# Patient Record
Sex: Male | Born: 1975 | Race: Black or African American | Hispanic: No | Smoking: Current some day smoker
Health system: Southern US, Community
[De-identification: ages and names within clinical notes are randomized; demographics above are authoritative.]

## PROBLEM LIST (undated history)

## (undated) DIAGNOSIS — J189 Pneumonia, unspecified organism: Secondary | ICD-10-CM

## (undated) DIAGNOSIS — R634 Abnormal weight loss: Secondary | ICD-10-CM

## (undated) DIAGNOSIS — D860 Sarcoidosis of lung: Secondary | ICD-10-CM

## (undated) DIAGNOSIS — I10 Essential (primary) hypertension: Secondary | ICD-10-CM

## (undated) HISTORY — DX: Sarcoidosis of lung: D86.0

## (undated) HISTORY — DX: Pneumonia, unspecified organism: J18.9

## (undated) HISTORY — DX: Essential (primary) hypertension: I10

## (undated) HISTORY — DX: Abnormal weight loss: R63.4

---

## 2005-07-24 ENCOUNTER — Emergency Department (HOSPITAL_COMMUNITY): Admission: EM | Admit: 2005-07-24 | Discharge: 2005-07-24 | Payer: Self-pay | Admitting: Emergency Medicine

## 2005-07-30 ENCOUNTER — Emergency Department (HOSPITAL_COMMUNITY): Admission: EM | Admit: 2005-07-30 | Discharge: 2005-07-30 | Payer: Self-pay | Admitting: Emergency Medicine

## 2005-08-08 ENCOUNTER — Ambulatory Visit: Payer: Self-pay | Admitting: Internal Medicine

## 2005-12-03 DIAGNOSIS — D860 Sarcoidosis of lung: Secondary | ICD-10-CM

## 2005-12-03 HISTORY — DX: Sarcoidosis of lung: D86.0

## 2006-01-26 ENCOUNTER — Emergency Department (HOSPITAL_COMMUNITY): Admission: EM | Admit: 2006-01-26 | Discharge: 2006-01-26 | Payer: Self-pay | Admitting: Emergency Medicine

## 2006-01-28 ENCOUNTER — Emergency Department (HOSPITAL_COMMUNITY): Admission: EM | Admit: 2006-01-28 | Discharge: 2006-01-28 | Payer: Self-pay | Admitting: Emergency Medicine

## 2006-02-01 ENCOUNTER — Ambulatory Visit: Payer: Self-pay | Admitting: Internal Medicine

## 2006-02-07 ENCOUNTER — Ambulatory Visit: Payer: Self-pay | Admitting: Internal Medicine

## 2006-02-07 ENCOUNTER — Ambulatory Visit: Admission: RE | Admit: 2006-02-07 | Discharge: 2006-02-07 | Payer: Self-pay | Admitting: Internal Medicine

## 2006-02-07 ENCOUNTER — Encounter (INDEPENDENT_AMBULATORY_CARE_PROVIDER_SITE_OTHER): Payer: Self-pay | Admitting: *Deleted

## 2006-02-07 HISTORY — PX: OTHER SURGICAL HISTORY: SHX169

## 2006-02-13 ENCOUNTER — Ambulatory Visit: Payer: Self-pay | Admitting: Internal Medicine

## 2006-03-28 ENCOUNTER — Ambulatory Visit: Payer: Self-pay | Admitting: Internal Medicine

## 2007-03-24 ENCOUNTER — Emergency Department (HOSPITAL_COMMUNITY): Admission: EM | Admit: 2007-03-24 | Discharge: 2007-03-24 | Payer: Self-pay | Admitting: Diagnostic Radiology

## 2007-08-12 ENCOUNTER — Emergency Department (HOSPITAL_COMMUNITY): Admission: EM | Admit: 2007-08-12 | Discharge: 2007-08-12 | Payer: Self-pay | Admitting: Emergency Medicine

## 2008-09-20 ENCOUNTER — Emergency Department (HOSPITAL_COMMUNITY): Admission: EM | Admit: 2008-09-20 | Discharge: 2008-09-20 | Payer: Self-pay | Admitting: Emergency Medicine

## 2009-02-27 ENCOUNTER — Emergency Department (HOSPITAL_COMMUNITY): Admission: EM | Admit: 2009-02-27 | Discharge: 2009-02-27 | Payer: Self-pay | Admitting: Emergency Medicine

## 2009-04-18 ENCOUNTER — Emergency Department (HOSPITAL_COMMUNITY): Admission: EM | Admit: 2009-04-18 | Discharge: 2009-04-18 | Payer: Self-pay | Admitting: Emergency Medicine

## 2009-06-07 DIAGNOSIS — D869 Sarcoidosis, unspecified: Secondary | ICD-10-CM | POA: Insufficient documentation

## 2009-06-07 DIAGNOSIS — R634 Abnormal weight loss: Secondary | ICD-10-CM | POA: Insufficient documentation

## 2009-06-07 DIAGNOSIS — I10 Essential (primary) hypertension: Secondary | ICD-10-CM | POA: Insufficient documentation

## 2009-06-07 DIAGNOSIS — R0602 Shortness of breath: Secondary | ICD-10-CM | POA: Insufficient documentation

## 2009-06-07 DIAGNOSIS — J189 Pneumonia, unspecified organism: Secondary | ICD-10-CM | POA: Insufficient documentation

## 2009-06-08 ENCOUNTER — Ambulatory Visit: Payer: Self-pay | Admitting: Internal Medicine

## 2009-06-18 ENCOUNTER — Emergency Department (HOSPITAL_COMMUNITY): Admission: EM | Admit: 2009-06-18 | Discharge: 2009-06-18 | Payer: Self-pay | Admitting: Emergency Medicine

## 2009-07-21 ENCOUNTER — Ambulatory Visit: Payer: Self-pay | Admitting: Internal Medicine

## 2009-09-01 ENCOUNTER — Ambulatory Visit: Payer: Self-pay | Admitting: Internal Medicine

## 2009-10-05 ENCOUNTER — Telehealth (INDEPENDENT_AMBULATORY_CARE_PROVIDER_SITE_OTHER): Payer: Self-pay | Admitting: *Deleted

## 2009-10-18 ENCOUNTER — Ambulatory Visit: Payer: Self-pay | Admitting: Internal Medicine

## 2010-05-24 ENCOUNTER — Ambulatory Visit: Payer: Self-pay | Admitting: Internal Medicine

## 2010-05-24 DIAGNOSIS — R079 Chest pain, unspecified: Secondary | ICD-10-CM | POA: Insufficient documentation

## 2010-10-03 ENCOUNTER — Telehealth: Payer: Self-pay | Admitting: Internal Medicine

## 2010-11-23 ENCOUNTER — Ambulatory Visit: Payer: Self-pay | Admitting: Internal Medicine

## 2010-12-25 ENCOUNTER — Telehealth: Payer: Self-pay | Admitting: Internal Medicine

## 2011-01-02 NOTE — Assessment & Plan Note (Signed)
Summary: Pulmonary/ f/u sarcoidosis   Primary Provider/Referring Provider:  none  CC:  Followup sarcoid.  Pt last seen in Nov 2010.  C/o chest tightness x 1 wk.  He has occ dry cough and has cp when coughs.  He has also noticed slight increased SOB with exertion since schest tightnesss started.  Marland Kitchen  History of Present Illness: 62  yobm quit smoking smoking 03/2009 dx with sarcoid 2006 cough wt loss sob, rx prednisone and eventually tapered it off in 2007  June 08, 2009 after hosp in May 2010 at [facility] cough with deep breath and sob with activity, feels just like his sarcoid.   rec prednisone 10 2  each am until better then taper to one half  July 21, 2009 ov 100% except doe x exertion so decreased  to 10 mg one half daily.   October 18, 2009 cc  his breathing has overall been okay.  Pt taking 5 mg of prednisone qd.  No complaints of aches/ pains/ rash/sob /coough/ocular co's. rec try pred 5 mg every other day then return for f/u, did not return as rec.  May 24, 2010 ov Followup sarcoid.  finished prednisone rx around 01/2010 breathing did fine off it completely.   Now C/o chest tightness x 1 wk.  He has occ dry cough and has cp when coughs.  He has also noticed slight increased cough > SOB with exertion since chest tightnesss started.  Had been lifting heavy wts at onset of pain localized to right anterior chest wall without radiation. Pt denies any significant sore throat, dysphagia, itching, sneezing,  nasal congestion or excess secretions,  fever, chills, sweats, unintended wt loss, classic lateralizing  pleuritic or exertional cp, hempoptysis, change in activity tolerance  orthopnea pnd or leg swelling, ocular c/o, rash, arthritis.  Current Medications (verified): 1)  Prednisone 10 Mg  Tabs (Prednisone) .Marland Kitchen.. 1 Once Daily As Directed 2)  Maxzide 75-50 Mg Tabs (Triamterene-Hctz) .... One Daily  Allergies (verified): No Known Drug Allergies  Past History:  Past Medical History: Hx of  PNEUMONIA (ICD-486) PULMONARY SARCOIDOSIS (ICD-135)....................................Marland KitchenWert    - Dx with TBBX here 02/07/06, ace 79    - Prednisone restarted 06/08/09 > stopped 01/2010 with recurrent cough 05/2010 Hx of WEIGHT LOSS (ICD-783.21) Hypertension after use of prednisone  Vital Signs:  Patient profile:   35 year old male Weight:      212 pounds BMI:     28.86 O2 Sat:      94 % on Room air Temp:     97.1 degrees F oral Pulse rate:   92 / minute BP sitting:   120 / 90  (right arm)  Vitals Entered By: Vernie Murders (May 24, 2010 2:20 PM)  O2 Flow:  Room air  Physical Exam  Additional Exam:  wt 223  June 09 2009 > 222 July 21, 2009 > 224 October 18, 2009 > 212 May 24, 2010  HEENT: nl dentition, turbinates, and orophanx. Nl external ear canals without cough reflex NECK :  without JVD/Nodes/TM/ nl carotid upstrokes bilaterally LUNGS: no acc muscle use, clear to A and P bilaterally without cough on insp or exp maneuvers CV:  RRR  no s3 or murmur or increase in P2, no edema  ABD:  soft and nontender with nl excursion in the supine position. No bruits or organomegaly, bowel sounds nl MS:  warm without deformities, calf tenderness, cyanosis or clubbing      CXR  Procedure date:  05/24/2010  Findings:      Comparison: 04/18/2009   Findings: Heart and mediastinal contours normal.  No evidence for an abnormal density in the right upper lobe on today's exam.  This density appears to have resolved.   No pleural fluid or osseous lesions.   IMPRESSION: Currently no active disease - right upper lobe density noted previously appears to have resolved.  Impression & Recommendations:  Problem # 1:  PULMONARY SARCOIDOSIS (ICD-135) The goal with a chronic steroid dependent illness is always arriving at the lowest effective dose that controls the disease/symptoms and not accepting a set "formula" which is based on statistics that don't take into accound individual variability or  the natural hx of the dz in every individual patient, which may well vary over time.  For now ceiling is 20 mg and floor will be 5 mg every other day, not 0, at least for now  Problem # 2:  CHEST PAIN (ICD-786.50)  c/w mscp  try rx with nsaids  Orders: Est. Patient Level III ([address])  Medications Added to Medication List This Visit: 1)  Prednisone 10 Mg Tabs (Prednisone) .Marland Kitchen.. 1 once daily as directed  Other Orders: T-2 View CXR (71020TC)  Patient Instructions: 1)  Prednisone ceiling is 20 mg per day so take 10 mg take 2 each am until back to normal then taper a floor of 10 mg one half even days 2)  Return to office in 3 months, sooner if needed  3)  Advil or aleve with meals will probably help with chest pain

## 2011-01-02 NOTE — Progress Notes (Signed)
Summary: refill on prednisone  Phone Note Call from Patient Call back at Home Phone [phone]   Caller: Patient Call For: wert Reason for Call: Refill Medication Summary of Call: Need refill on prednisone 10mg .//[facility] [address] Initial call taken by: Darletta Moll,  October 03, 2010 4:15 PM  Follow-up for Phone Call        pt states he is currenlty takin g1/2 10mg  tablet every other day. refill sent.Carron Curie CMA  October 03, 2010 4:58 PM     Prescriptions: PREDNISONE 10 MG  TABS (PREDNISONE) 1 once daily as directed  #30 x 3   Entered by:   Carron Curie CMA   Authorized by:   Nyoka Cowden MD   Signed by:   Carron Curie CMA on 10/03/2010   Method used:   Electronically to        [facility] [address] (retail)       [phone]       [place], [place]  [address]       Ph: [phone]       Fax: [phone]   RxID:   [id number]

## 2011-01-04 NOTE — Progress Notes (Signed)
Summary: nos appt  Phone Note Call from Patient   Caller: [email or web address]  Call For: Jacier Gladu Summary of Call: Rsc nos from 1/20 to 2/14. Initial call taken by: Darletta Moll,  December 25, 2010 10:14 AM

## 2011-01-16 ENCOUNTER — Ambulatory Visit: Payer: Self-pay | Admitting: Internal Medicine

## 2011-01-31 ENCOUNTER — Ambulatory Visit: Payer: Self-pay | Admitting: Internal Medicine

## 2011-03-20 ENCOUNTER — Telehealth: Payer: Self-pay | Admitting: Internal Medicine

## 2011-03-20 NOTE — Telephone Encounter (Signed)
Last chance 1) schedule ov 2) give just enough for to last until ov 3) no more without keeping this appt

## 2011-03-20 NOTE — Telephone Encounter (Signed)
Pt hasn't seen MW since June 2011!!  And at that time, was instructed to f/u in 3 months which pt never did.  Pt keeps either cancelling or no showing for his appts.  MW please advise if you want to refill his Prednisone or not.  Thanks.

## 2011-03-21 ENCOUNTER — Telehealth: Payer: Self-pay | Admitting: Internal Medicine

## 2011-03-21 MED ORDER — PREDNISONE 10 MG PO TABS: 10.0000 mg | ORAL_TABLET | Freq: Every day | ORAL | Status: AC

## 2011-03-21 NOTE — Telephone Encounter (Signed)
Pt wanting refill on Prednisone. Previous phone note was closed from 03/20/11.  Per MW Last chance : 1) schedule ov  2) give just enough for to last until ov  3) no more without keeping this appt  LMOMTCB X1 to schedule OV so we can send pred enough to last to OV

## 2011-03-21 NOTE — Telephone Encounter (Signed)
Pt scheduled OV with MW on Thurs., 04/05/2011 @ 9:45 am and is aware he will need to keep that appt for any further refills. RX for Prednisone sent to [facility] on [address].

## 2011-04-03 ENCOUNTER — Encounter: Payer: Self-pay | Admitting: Internal Medicine

## 2011-04-05 ENCOUNTER — Telehealth: Payer: Self-pay | Admitting: Internal Medicine

## 2011-04-05 ENCOUNTER — Ambulatory Visit: Payer: Self-pay | Admitting: Internal Medicine

## 2011-04-05 MED ORDER — PREDNISONE 10 MG PO TABS: 10.0000 mg | ORAL_TABLET | Freq: Every day | ORAL | Status: AC

## 2011-04-05 NOTE — Telephone Encounter (Signed)
Pt wants refill on his prednisone 10 mg. Pt was last seen 05/24/10 and was to f/u in 3 months and has not. Pt has an apt coming up 5/23 at 10:30. Please advise Dr. Sherene Sires if okay to refill. Thanks  Carver Fila, CMA

## 2011-04-05 NOTE — Telephone Encounter (Signed)
Only give enough that will last until ov at a dose of no more than 10 mg daily = 20 pills x 10 mg each but have him continue to take them as per last set of instructions

## 2011-04-05 NOTE — Telephone Encounter (Signed)
Pt is aware and will keep ov on 5/23 with MW.

## 2011-04-17 NOTE — Consult Note (Signed)
NAMEAB, LEAMING NO.:  [id number]   MEDICAL RECORD NO.:  [id number]          PATIENT TYPE:  EMS   LOCATION:  [facility]                         FACILITY:  [facility]   PHYSICIAN:  Renee Ramus, MD       DATE OF BIRTH:  06/19/1976   DATE OF CONSULTATION:  09/20/2008  DATE OF DISCHARGE:  09/20/2008                                 CONSULTATION   HISTORY OF PRESENT ILLNESS:  The patient is a 35 year old male who is  seen for electric shocks that occur along the precordium of his chest,  increasing with inspiration and cough.  The patient says this is  consistent with previous episodes of sarcoidosis.  The patient denies  fever, chills, night sweats, nausea, vomiting, PND, or orthopnea.  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  The  patient is currently asymptomatic.  He says that his pain has resolved.  The patient says that the pain lasts only for seconds.  He is  exacerbated with deep inspiration and with cough.   PAST MEDICAL HISTORY:  Sarcoidosis.   SOCIAL HISTORY:  The patient reports smoking 3-4 cigarettes a week.  No  alcohol or drug use.  Lives at home.   FAMILY HISTORY:  Not available.   REVIEW OF SYSTEMS:  All other comprehensive review of systems are  negative.   MEDICATIONS:  None.   ALLERGIES:  NKDA.   PHYSICAL EXAMINATION:  GENERAL:  This is a well-developed, well-  nourished black male, currently no apparent distress.  VITAL SIGNS:  Temperature 97, respiratory rate 20, heart rate 82, and  blood pressure 152/102.  NECK:  No jugular venous distention or lymphadenopathy.  HEENT:  Oropharynx is clear.  Mucous membranes pink and moist.  TMs  clear bilaterally.  Pupils equal, reactive to light and accommodation.  Extraocular muscles are intact.  CARDIOVASCULAR:  He has a regular rate and  rhythm without murmurs, rubs,  or gallops.  He has physiologically split S2.  PULMONARY:  Lungs are clear to auscultation bilaterally with the  exception of some faint wheezes at the bases.  ABDOMEN:  Soft, nontender, and nondistended without hepatosplenomegaly.  Bowel sounds are present.  There is no rebound or guarding.  EXTREMITIES:  He has no clubbing, cyanosis, or edema.  He has good  peripheral pulses in dorsalis pedis and radial arteries.  He is able to  move all extremities.  NEUROLOGIC:  Cranial nerves II through XII are grossly intact.  He has  no focal neurological deficits.   LABORATORY STUDIES:  White count 6.6, H and H 17 and 50, MCV 87, and  platelets 367.  Sodium 140, potassium 3.7, chloride 99, bicarb 32, BUN  14, creatinine 1.3, and glucose 98.   EKG shows left anterior fascicular block with left axis deviation, and  although it is read as septal infarct.  He actually has Q waves, so he  has no evidence of infarct.  Troponin I is less than 0.05.  Chest x-ray  shows hyperinflated lungs with flattened diaphragms bilaterally and some  hilar adenopathy bilaterally.   ASSESSMENT AND PLAN:  1. Sarcoidosis.  The patient's atypical chest pain is most likely      resultant of his progressive sarcoidosis.  He has not been taking      any medicine for this and has not seen by outside physician for      this.  We will institute therapy with prednisone 40 mg daily and      follow up with Pulmonology.  2. Atypical chest pain.  I do not believe this represents acute      coronary syndrome.  The patient's risk factors are relatively low.      His chest pain is certainly atypical and he is at really low risk      for this.  I am going to do further workup in this area.  3. Erythrocytosis, most likely secondary to hypoxia.  Question      deteriorating lung function with advancing either obstructive or      restrictive lung disease from sarcoid.  I am concerned the patient       needs pulmonary function testing and additional monitoring as an      outpatient.  Again, he should follow up with Pulmonology.  4. Hypertension.  We will begin ACE inhibitor, lisinopril and follow      up with primary care physician.  5. Disposition.  We will discharge home with pulmonary followup.   Consult note was constructed by reviewing past medical history,  confirming with emergency medical room physician, and reviewing the  emergency medical record.   TIME SPENT:  One hour.      Renee Ramus, MD  Electronically Signed     JF/MEDQ  D:  09/20/2008  T:  09/21/2008  Job:  [id number]

## 2011-04-19 ENCOUNTER — Encounter: Payer: Self-pay | Admitting: Internal Medicine

## 2011-04-20 NOTE — Op Note (Signed)
Kenneth Delacruz, Kenneth Delacruz               ACCOUNT NO.:  [id number]   MEDICAL RECORD NO.:  [id number]          PATIENT TYPE:  AMB   LOCATION:  CARD                         FACILITY:  [facility]   PHYSICIAN:  Casimiro Needle B. Sherene Sires, M.D. Romualdo Bolk OF BIRTH:  05-28-1976   DATE OF PROCEDURE:  02/07/2006  DATE OF DISCHARGE:                                 OPERATIVE REPORT   REFERRED BY:  Manning Charity, MD.   INDICATIONS FOR PROCEDURE:  Please see attached pulmonary consultation note  done in the office within the last week. There has been no change in the  history or exam since the evaluation. The patient agreed to the procedure  after a full discussion of the risks, benefits, and alternatives.   The procedure was done in the bronchoscopy suite with continuous monitoring  by surface ECG and oximetry. The patient maintained adequate saturations  throughout this procedure in sinus rhythm on 4 liters oxygen per nasal  prongs.   Using a standard flexible fiberoptic bronchoscope, the left naris was easily  cannulated with good visualization of the entire oropharynx and  larynx and  no obvious lesion seen.   Using an additional 1% lidocaine as needed, the entire tracheobronchial tree  was explored bilaterally with the following findings.   Severe extensive diffuse cobblestoning involving all the airways but all the  airways did open widely to inspiration. No excess secretions.   DESCRIPTION OF PROCEDURE:  1.  Using a wedge positioner within the apical segment of the right upper      lobe, four adequate transbronchial biopsies were obtained with no      obvious immediate, pneumothorax or hemorrhage.  2.  The right middle lobe was lavaged for AFB and fungal stain and culture      and cytology.   The patient tolerated the procedure well with followup chest x-ray pending.   IMPRESSION:  Sarcoidosis involving the airways and lung parenchyma.   RECOMMENDATIONS:  Await confirmatory biopsies before initiating  therapy and  also rule out tuberculosis or other __________ processes.   ADDENDUM:  He was premedicated with a total of 50 mg of IV Demerol and 5 mg  of  IV Versed for adequate sedation and cough suppression. Additionally his  airways were prepared with 1% lidocaine by updraft nebulizer and the left  and right nares were prepared with 2% lidocaine jelly.           ______________________________  Charlaine Dalton Sherene Sires, M.D. [facility]     MBW/MEDQ  D:  02/07/2006  T:  02/08/2006  Job:  [id number]   cc:   Manning Charity, MD  Fax: [phone]

## 2011-04-25 ENCOUNTER — Ambulatory Visit (INDEPENDENT_AMBULATORY_CARE_PROVIDER_SITE_OTHER): Payer: Self-pay | Admitting: Internal Medicine

## 2011-04-25 ENCOUNTER — Ambulatory Visit (INDEPENDENT_AMBULATORY_CARE_PROVIDER_SITE_OTHER)
Admission: RE | Admit: 2011-04-25 | Discharge: 2011-04-25 | Disposition: A | Discharge status: Home / Self Care | Payer: Self-pay | Source: Ambulatory Visit | Attending: Internal Medicine | Admitting: Internal Medicine

## 2011-04-25 ENCOUNTER — Encounter: Payer: Self-pay | Admitting: Internal Medicine

## 2011-04-25 VITALS — BP 148/94 | HR 70 | Temp 98.5°F | Ht 71.0 in | Wt 225.6 lb

## 2011-04-25 DIAGNOSIS — D869 Sarcoidosis, unspecified: Secondary | ICD-10-CM

## 2011-04-25 DIAGNOSIS — I10 Essential (primary) hypertension: Secondary | ICD-10-CM

## 2011-04-25 NOTE — Assessment & Plan Note (Signed)
The goal with a chronic steroid dependent illness is always arriving at the lowest effective dose that controls the disease/symptoms and not accepting a set "formula" which is based on statistics or guidelines that don't always take into account patient  variability or the natural hx of the dz in every individual patient, which may well vary over time.  For now therefore I recommend the patient maintain  20 mg as ceiling and 5 mg qod as floor but only if symptoms recur off it - this many years out it may well not flare at this pont off rx.

## 2011-04-25 NOTE — Progress Notes (Signed)
Subjective:     Patient ID: Kenneth Delacruz, male   DOB: 1976/05/21, 35 y.o.   MRN: [id number]  HPI   35  yobm quit smoking smoking 03/2009 dx with sarcoid 2006 cough wt loss sob, rx prednisone and eventually tapered it off in 2007   June 08, 2009 after hosp in May 2010 at [facility] cough with deep breath and sob with activity, feels just like his sarcoid. rec prednisone 10 2 each am until better then taper to one half   July 21, 2009 ov 100% except doe x exertion so decreased to 10 mg one half daily.   October 18, 2009 cc his breathing has overall been okay. Pt taking 5 mg of prednisone qd. No complaints of aches/ pains/ rash/sob /coough/ocular co's. rec try pred 5 mg every other day then return for f/u, did not return as rec.   May 24, 2010 ov Followup sarcoid. finished prednisone rx around 01/2010 breathing did fine off it completely. > maintain on 5 mg qod   04/25/2011 ov/Shakerria Parran cc doe heavy ex only. No ocular or articular symptoms, rash or cough off prednisone x 2 weeks when ran out without flare of any of the previous symptoms.  Pt denies any significant sore throat, dysphagia, itching, sneezing,  nasal congestion or excess/ purulent secretions,  fever, chills, sweats, unintended wt loss, pleuritic or exertional cp, hempoptysis, orthopnea pnd or leg swelling.    Also denies any obvious fluctuation of symptoms with weather or environmental changes or other aggravating or alleviating factors.     :  Past Medical History:  Hx of PNEUMONIA (ICD-486)  PULMONARY SARCOIDOSIS (ICD-135)....................................Marland KitchenWert  - Dx with TBBX here 02/07/06, ace 79  - Prednisone restarted 06/08/09 > stopped 01/2010 with recurrent cough 05/2010  Hx of WEIGHT LOSS (ICD-783.21)  Hypertension after use of prednisone     Review of Systems     Objective:   Physical Exam amb bm nad   Wt 225 04/25/2011  HEENT: nl dentition, turbinates, and orophanx. Nl external ear canals without cough reflex   NECK :   without JVD/Nodes/TM/ nl carotid upstrokes bilaterally   LUNGS: no acc muscle use, clear to A and P bilaterally without cough on insp or exp maneuvers   CV:  RRR  no s3 or murmur or increase in P2, no edema   ABD:  soft and nontender with nl excursion in the supine position. No bruits or organomegaly, bowel sounds nl  MS:  warm without deformities, calf tenderness, cyanosis or clubbing  SKIN: warm and dry without lesions    NEURO:  alert, approp, no deficits     cxr 04/25/2011  Comparison: PA and lateral chest 05/24/2010 and 04/18/2009. CT  chest 01/26/2006.  Findings: Lungs are clear. Heart size is normal. No pneumothorax  or pleural effusion. No focal bony abnormality.  IMPRESSION:  No acute finding.  Assessment:         Plan:

## 2011-04-25 NOTE — Assessment & Plan Note (Signed)
May well resolve if not on steroids, advised to avoid salt and follow

## 2011-04-25 NOTE — Patient Instructions (Addendum)
The lowest dose of prednisone is always the best dose as long as it works  Try off prednisone completely and if the same problem comes back start back on prednisone 10 mg per day until back to normal, then one half x two weeks and then one half on odd days until seen every 3 months

## 2011-04-26 NOTE — Progress Notes (Signed)
Quick Note:  Spoke with pt and notified of results per Dr. Wert. Pt verbalized understanding and denied any questions.  ______ 

## 2011-07-17 ENCOUNTER — Telehealth: Payer: Self-pay | Admitting: Internal Medicine

## 2011-07-17 ENCOUNTER — Other Ambulatory Visit: Payer: Self-pay | Admitting: Internal Medicine

## 2011-07-17 MED ORDER — PREDNISONE 10 MG PO TABS: ORAL_TABLET | ORAL | Status: DC

## 2011-07-17 NOTE — Telephone Encounter (Signed)
Spoke with patient-aware that refills have been sent (spoke with CY regarding amount to send to pharmacy).

## 2011-08-07 ENCOUNTER — Ambulatory Visit: Payer: Self-pay | Admitting: Internal Medicine

## 2011-08-21 ENCOUNTER — Ambulatory Visit: Payer: Self-pay | Admitting: Internal Medicine

## 2011-09-03 LAB — CBC
HCT: 47.3
Hemoglobin: 15.7
MCHC: 33.2
Platelets: 367
RDW: 13.8
WBC: 6.6

## 2011-09-03 LAB — POCT I-STAT 3, ART BLOOD GAS (G3+)
Acid-Base Excess: 2
Bicarbonate: 27.7 — ABNORMAL HIGH
O2 Saturation: 98
Patient temperature: 98.6

## 2011-09-03 LAB — POCT CARDIAC MARKERS
CKMB, poc: 2.4
Troponin i, poc: <0.05

## 2011-09-03 LAB — DIFFERENTIAL
Basophils Absolute: 0
Eosinophils Absolute: 0.1
Lymphs Abs: 2

## 2011-09-03 LAB — D-DIMER, QUANTITATIVE: D-Dimer, Quant: <0.22

## 2011-09-03 LAB — POCT I-STAT, CHEM 8
BUN: 14
Calcium, Ion: 1.22
Creatinine, Ser: 1.3
Glucose, Bld: 98
Potassium: 3.7
TCO2: 32

## 2011-09-03 LAB — URINALYSIS, ROUTINE W REFLEX MICROSCOPIC
Nitrite: NEGATIVE
Specific Gravity, Urine: 1.016
Urobilinogen, UA: 0.2

## 2011-09-11 ENCOUNTER — Encounter: Payer: Self-pay | Admitting: Internal Medicine

## 2011-09-11 ENCOUNTER — Ambulatory Visit (INDEPENDENT_AMBULATORY_CARE_PROVIDER_SITE_OTHER): Payer: Self-pay | Admitting: Internal Medicine

## 2011-09-11 DIAGNOSIS — D869 Sarcoidosis, unspecified: Secondary | ICD-10-CM

## 2011-09-11 DIAGNOSIS — I10 Essential (primary) hypertension: Secondary | ICD-10-CM

## 2011-09-11 MED ORDER — PREDNISONE 10 MG PO TABS: ORAL_TABLET | ORAL | Status: DC

## 2011-09-11 MED ORDER — TRIAMTERENE-HCTZ 75-50 MG PO TABS: ORAL_TABLET | ORAL | Status: DC

## 2011-09-11 NOTE — Patient Instructions (Addendum)
The lowest dose of prednisone is always the best dose as long as it works - zero prednisone does not appear to be an adequate dose.  Restart  Prednisone 10 mg per day until back to normal, then one half x two weeks and then one half on odd days only  until seen every 3 months - if condition worsens go back to 10 mg per day and start.

## 2011-09-11 NOTE — Progress Notes (Signed)
Subjective:     Patient ID: Kenneth Delacruz, male   DOB: 04/06/1976, 35 y.o.   MRN: [id number]  HPI   34  yobm quit smoking smoking 03/2009 dx with sarcoid 2006 cough wt loss sob, rx prednisone and eventually tapered it off in 2007   June 08, 2009 after hosp in May 2010 at [facility] cough with deep breath and sob with activity, feels just like his sarcoid. rec prednisone 10 2 each am until better then taper to one half   July 21, 2009 ov 100% except doe x exertion so decreased to 10 mg one half daily.   October 18, 2009 cc his breathing has overall been okay. Pt taking 5 mg of prednisone qd. No complaints of aches/ pains/ rash/sob /coough/ocular co's. rec try pred 5 mg every other day then return for f/u, did not return as rec.   May 24, 2010 ov Followup sarcoid. finished prednisone rx around 01/2010 breathing did fine off it completely. > maintain on 5 mg qod   04/25/2011 ov/Kenneth Delacruz cc doe heavy ex only. No ocular or articular symptoms, rash or cough off prednisone x 2 weeks when ran out without flare of any of the previous symptoms. rec The lowest dose of prednisone is always the best dose as long as it works  Try off prednisone completely and if the same problem comes back start back on prednisone 10 mg per day until back to normal, then one half x two weeks and then one half on odd days until seen every 3 months     09/11/2011 f/u ov/Kenneth Delacruz cc increased SOB and prod cough with clear sputum for approx 10 days - this occurred when he stopped prednisone 2 weeks ago.  Pt denies any significant sore throat, dysphagia, itching, sneezing,  nasal congestion or excess/ purulent secretions,  fever, chills, sweats, unintended wt loss, pleuritic or exertional cp, hempoptysis, orthopnea pnd or leg swelling.    Also denies any obvious fluctuation of symptoms with weather or environmental changes or other aggravating or alleviating factors.     :  Past Medical History:  Hx of PNEUMONIA (ICD-486)  PULMONARY  SARCOIDOSIS (ICD-135)....................................Marland KitchenWert  - Dx with TBBX here 02/07/06, ace 79  - Prednisone restarted 06/08/09 > stopped 01/2010 with recurrent cough 05/2010  Hx of WEIGHT LOSS (ICD-783.21)  Hypertension after use of prednisone     Review of Systems     Objective:   Physical Exam  amb bm nad    Wt 225 04/25/2011  > 09/11/2011  217  HEENT: nl dentition, turbinates, and orophanx. Nl external ear canals without cough reflex   NECK :  without JVD/Nodes/TM/ nl carotid upstrokes bilaterally   LUNGS: no acc muscle use, clear to A and P bilaterally without cough on insp or exp maneuvers   CV:  RRR  no s3 or murmur or increase in P2, no edema   ABD:  soft and nontender with nl excursion in the supine position. No bruits or organomegaly, bowel sounds nl  MS:  warm without deformities, calf tenderness, cyanosis or clubbing      cxr 04/25/2011  Comparison: PA and lateral chest 05/24/2010 and 04/18/2009. CT  chest 01/26/2006.  Findings: Lungs are clear. Heart size is normal. No pneumothorax  or pleural effusion. No focal bony abnormality.  IMPRESSION:  No acute finding.  Assessment:         Plan:

## 2011-09-12 ENCOUNTER — Encounter: Payer: Self-pay | Admitting: Internal Medicine

## 2011-09-12 NOTE — Assessment & Plan Note (Signed)
Once again flared w/in a week of stopping steroids completely.  Appears he needs a very low dose, which should be tolerable systemically, to control his pulmonary symptoms.  Try to maintain floor of 5 mg qod

## 2011-09-12 NOTE — Assessment & Plan Note (Signed)
Tends to have this while on prednsione,  Adequate control on present rx, reviewed

## 2012-03-31 ENCOUNTER — Telehealth: Payer: Self-pay | Admitting: Internal Medicine

## 2012-03-31 NOTE — Telephone Encounter (Signed)
Pt states he is feeling sick; lost his wife 2 weeks ago and since has started smoking; now having increased SOB and cough(non productive); Pt stated MW told him if he got like this to let him know so MW could give him prednisone RX. Pt last seen 09-11-2011. MW Please advise. Thanks.

## 2012-03-31 NOTE — Telephone Encounter (Signed)
Sorry to hear of his loss but really needs ov to be able to give him the quality care he deserves - ok to add on or see Tammy NP

## 2012-03-31 NOTE — Telephone Encounter (Signed)
I spoke with pt and he is scheduled to come in and see TP tomorrow at 12:00

## 2012-04-01 ENCOUNTER — Encounter: Payer: Self-pay | Admitting: Adult Health

## 2012-04-01 ENCOUNTER — Ambulatory Visit (INDEPENDENT_AMBULATORY_CARE_PROVIDER_SITE_OTHER): Payer: Self-pay | Admitting: Adult Health

## 2012-04-01 VITALS — BP 156/98 | HR 58 | Temp 97.3°F | Ht 70.5 in | Wt 219.2 lb

## 2012-04-01 DIAGNOSIS — D869 Sarcoidosis, unspecified: Secondary | ICD-10-CM

## 2012-04-01 MED ORDER — PREDNISONE 10 MG PO TABS: ORAL_TABLET | ORAL | Status: DC

## 2012-04-01 NOTE — Progress Notes (Signed)
Subjective:     Patient ID: Kenneth Delacruz, male   DOB: 1976-07-30, 36 y.o.   MRN: [id number]  HPI  35  yobm quit smoking smoking 03/2009 dx with sarcoid 2006 cough wt loss sob, rx prednisone and eventually tapered it off in 2007   June 08, 2009 after hosp in May 2010 at [facility] cough with deep breath and sob with activity, feels just like his sarcoid. rec prednisone 10 2 each am until better then taper to one half   July 21, 2009 ov 100% except doe x exertion so decreased to 10 mg one half daily.   October 18, 2009 cc his breathing has overall been okay. Pt taking 5 mg of prednisone qd. No complaints of aches/ pains/ rash/sob /coough/ocular co's. rec try pred 5 mg every other day then return for f/u, did not return as rec.   May 24, 2010 ov Followup sarcoid. finished prednisone rx around 01/2010 breathing did fine off it completely. > maintain on 5 mg qod   04/25/2011 ov/Wert cc doe heavy ex only. No ocular or articular symptoms, rash or cough off prednisone x 2 weeks when ran out without flare of any of the previous symptoms. rec The lowest dose of prednisone is always the best dose as long as it works  Try off prednisone completely and if the same problem comes back start back on prednisone 10 mg per day until back to normal, then one half x two weeks and then one half on odd days until seen every 3 months     09/11/2011 f/u ov/Wert cc increased SOB and prod cough with clear sputum for approx 10 days - this occurred when he stopped prednisone 2 weeks ago. >restarted prednisone    04/01/2012 Acute OV  Complains of increased SOB, wheezing, dry cough x3-4weeks, since restarted smoking Ran out of prednisone 3 weeks ago.  Under a lot of stress, wife passed last month (complications of Lupus )  Started back smoking. We discussed smoking cesstation  No discolored mucus or fever.       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  HEENT:   No headaches,  Difficulty swallowing,  Tooth/dental problems, or  Sore throat,                No sneezing, itching, ear ache, nasal congestion, post nasal drip,   CV:  No chest pain,  Orthopnea, PND, swelling in lower extremities, anasarca, dizziness, palpitations, syncope.   GI  No heartburn, indigestion, abdominal pain, nausea, vomiting, diarrhea, change in bowel habits, loss of appetite, bloody stools.   Resp:    No coughing up of blood.  No change in color of mucus.  No wheezing.  No chest wall deformity  Skin: no rash or lesions.  GU: no dysuria, change in color of urine, no urgency or frequency.  No flank pain, no hematuria   MS:  No joint pain or swelling.  No decreased range of motion.  No back pain.  Psych:  No change in mood or affect. No depression or anxiety.  No memory loss.  Objective:   Physical Exam  amb bm nad    Wt 225 04/25/2011  > 09/11/2011  217> 04/01/2012 219   HEENT: nl dentition, turbinates, and orophanx. Nl external ear canals without cough reflex   NECK :  without JVD/Nodes/TM/ nl carotid upstrokes bilaterally   LUNGS: no acc muscle use, clear to A and P bilaterally without cough on insp or exp maneuvers   CV:  RRR  no s3 or murmur or increase in P2, no edema   ABD:  soft and nontender with nl excursion in the supine position. No bruits or organomegaly, bowel sounds nl  MS:  warm without deformities, calf tenderness, cyanosis or clubbing               Plan:

## 2012-04-01 NOTE — Assessment & Plan Note (Signed)
Flare off steroids  Pt declined cxr today  Plan:  Restart prednisone 10mg  daily , then taper to 1/2 every other day.  follow up 1 month with Dr. Sherene Sires  And As needed

## 2012-04-01 NOTE — Patient Instructions (Signed)
Restart  Prednisone 10 mg per day until back to normal, then  one half x two weeks and then one half every other day -hold at this dose  if condition worsens go back to 10 mg per day and start taper over.  follow up Dr. Sherene Sires  In 6 weeks and As needed  Please contact office for sooner follow up if symptoms do not improve or worsen or seek emergency care  Work on stopping smoking  We are very sorry for your loss.

## 2012-09-14 ENCOUNTER — Other Ambulatory Visit: Payer: Self-pay | Admitting: Internal Medicine

## 2012-10-24 ENCOUNTER — Telehealth: Payer: Self-pay | Admitting: Internal Medicine

## 2012-10-24 MED ORDER — TRIAMTERENE-HCTZ 75-50 MG PO TABS: 1.0000 | ORAL_TABLET | Freq: Every day | ORAL | Status: DC

## 2012-10-24 NOTE — Telephone Encounter (Signed)
Pt aware must keep appt for further refills. Nothing further was needed

## 2012-10-31 ENCOUNTER — Encounter: Payer: Self-pay | Admitting: Internal Medicine

## 2012-10-31 NOTE — Progress Notes (Signed)
 This encounter was created in error - please disregard.

## 2012-11-17 ENCOUNTER — Ambulatory Visit: Payer: Self-pay | Admitting: Internal Medicine

## 2012-12-04 ENCOUNTER — Telehealth: Payer: Self-pay | Admitting: Internal Medicine

## 2012-12-04 MED ORDER — TRIAMTERENE-HCTZ 75-50 MG PO TABS: 1.0000 | ORAL_TABLET | Freq: Every day | ORAL | Status: DC

## 2012-12-04 NOTE — Telephone Encounter (Signed)
Pt needs a refill on his Maxzide sent to the pharmacy. He will see MW on 12/15/12.

## 2012-12-08 ENCOUNTER — Ambulatory Visit: Payer: Self-pay | Admitting: Internal Medicine

## 2012-12-15 ENCOUNTER — Ambulatory Visit: Payer: Self-pay | Admitting: Internal Medicine

## 2012-12-25 ENCOUNTER — Ambulatory Visit (INDEPENDENT_AMBULATORY_CARE_PROVIDER_SITE_OTHER): Payer: Self-pay | Admitting: Internal Medicine

## 2012-12-25 ENCOUNTER — Encounter: Payer: Self-pay | Admitting: Internal Medicine

## 2012-12-25 VITALS — BP 144/96 | HR 80 | Temp 98.0°F | Ht 71.0 in | Wt 228.0 lb

## 2012-12-25 DIAGNOSIS — F17200 Nicotine dependence, unspecified, uncomplicated: Secondary | ICD-10-CM

## 2012-12-25 DIAGNOSIS — D869 Sarcoidosis, unspecified: Secondary | ICD-10-CM

## 2012-12-25 DIAGNOSIS — I10 Essential (primary) hypertension: Secondary | ICD-10-CM

## 2012-12-25 MED ORDER — TRIAMTERENE-HCTZ 75-50 MG PO TABS: 1.0000 | ORAL_TABLET | Freq: Every day | ORAL | Status: DC

## 2012-12-25 MED ORDER — PREDNISONE 10 MG PO TABS: ORAL_TABLET | ORAL | Status: DC

## 2012-12-25 NOTE — Patient Instructions (Addendum)
Prednisone ceiling is 10 mg day and floor is 5 mg every other day  Please schedule a follow up office visit in 6 weeks, call sooner if needed PFT's and CxR

## 2012-12-25 NOTE — Assessment & Plan Note (Signed)
Not adequate control on present rx, reviewed need to take daily and minimize steroids/ smoking

## 2012-12-25 NOTE — Assessment & Plan Note (Signed)
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  For now therefore I recommend the patient maintain  Ceiling 10 and floor 5 qod  Needs restaging and opth eval this year> advised

## 2012-12-25 NOTE — Assessment & Plan Note (Signed)
Strongly advised to quit.

## 2012-12-25 NOTE — Progress Notes (Signed)
Subjective:     Patient ID: Kenneth Delacruz, male   DOB: May 25, 1976, 37 y.o.   MRN: [id number]  HPI  35  yobm quit smoking smoking 03/2009 dx with sarcoid 2006 cough wt loss sob, rx prednisone and eventually tapered it off in 2007   June 08, 2009 after hosp in May 2010 at [facility] cough with deep breath and sob with activity, feels just like his sarcoid. rec prednisone 10 2 each am until better then taper to one half   July 21, 2009 ov 100% except doe x exertion so decreased to 10 mg one half daily.   October 18, 2009 cc his breathing has overall been okay. Pt taking 5 mg of prednisone qd. No complaints of aches/ pains/ rash/sob /coough/ocular co's. rec try pred 5 mg every other day then return for f/u, did not return as rec.   May 24, 2010 ov Followup sarcoid. finished prednisone rx around 01/2010 breathing did fine off it completely. > maintain on 5 mg qod   04/25/2011 ov/Kenneth Delacruz cc doe heavy ex only. No ocular or articular symptoms, rash or cough off prednisone x 2 weeks when ran out without flare of any of the previous symptoms. rec The lowest dose of prednisone is always the best dose as long as it works  Try off prednisone completely and if the same problem comes back start back on prednisone 10 mg per day until back to normal, then one half x two weeks and then one half on odd days until seen every 3 months     09/11/2011 f/u ov/Kenneth Delacruz cc increased SOB and prod cough with clear sputum for approx 10 days - this occurred when he stopped prednisone 2 weeks ago. >restarted prednisone    04/01/2012 Acute OV  Complains of increased SOB, wheezing, dry cough x3-4weeks, since restarted smoking Ran out of prednisone 3 weeks ago.  Under a lot of stress, wife passed last month (complications of Lupus )  Started back smoking. We discussed smoking cesstation  rec Restart  Prednisone 10 mg per day until back to normal, then  one half x two weeks and then one half every other day -hold at this dose  if  condition worsens go back to 10 mg per day and start taper over.  Please contact office for sooner follow up if symptoms do not improve or worsen or seek emergency care  Work on stopping smoking  We are very sorry for your loss   12/25/2012 f/u ov/Kenneth Delacruz prednisone down to 5 mg qod then increased to 10 mg per day x 6 weeks due to tightness/ cough ? Also maybe related to resuming smoking with loss of wife.  No rash or arthralgias.   Sleeping ok without nocturnal  or early am exacerbation  of respiratory  c/o's or need for noct saba. 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 Nl external  ear canals without cough reflex   NECK :  without JVD/Nodes/TM/ nl carotid upstrokes bilaterally   LUNGS: no acc muscle use, clear to A and P bilaterally without cough on insp or exp maneuvers   CV:  RRR  no s3 or murmur or increase in P2, no edema   ABD:  soft and nontender with nl excursion in the supine position. No bruits or organomegaly, bowel sounds nl  MS:  warm without deformities, calf tenderness, cyanosis or clubbing               Plan:

## 2013-02-06 ENCOUNTER — Ambulatory Visit: Payer: Self-pay | Admitting: Internal Medicine

## 2013-03-06 ENCOUNTER — Ambulatory Visit: Payer: Self-pay | Admitting: Internal Medicine

## 2013-04-20 ENCOUNTER — Ambulatory Visit: Payer: Self-pay | Admitting: Internal Medicine

## 2013-09-11 ENCOUNTER — Telehealth: Payer: Self-pay | Admitting: Internal Medicine

## 2013-09-11 NOTE — Telephone Encounter (Signed)
lmomtcb x1 for pt Pt has NS for the past 3 OV's and PFT's w/ MW Pt needs an appt

## 2013-09-14 NOTE — Telephone Encounter (Signed)
I spoke with pt. He stated he is not able to have PFT done bc he is having teeth removed this week. He scheduled an appt to see MW on Monday. Nothing further needed

## 2013-09-14 NOTE — Telephone Encounter (Signed)
LMTCB

## 2013-09-21 ENCOUNTER — Ambulatory Visit: Payer: Self-pay | Admitting: Internal Medicine

## 2013-11-16 ENCOUNTER — Encounter: Payer: Self-pay | Admitting: Internal Medicine

## 2013-11-16 ENCOUNTER — Ambulatory Visit (INDEPENDENT_AMBULATORY_CARE_PROVIDER_SITE_OTHER): Payer: Self-pay | Admitting: Internal Medicine

## 2013-11-16 VITALS — BP 144/90 | HR 65 | Temp 98.5°F | Ht 70.0 in | Wt 215.8 lb

## 2013-11-16 DIAGNOSIS — D869 Sarcoidosis, unspecified: Secondary | ICD-10-CM

## 2013-11-16 DIAGNOSIS — I10 Essential (primary) hypertension: Secondary | ICD-10-CM

## 2013-11-16 DIAGNOSIS — F17200 Nicotine dependence, unspecified, uncomplicated: Secondary | ICD-10-CM

## 2013-11-16 MED ORDER — PREDNISONE 10 MG PO TABS: ORAL_TABLET | ORAL | Status: DC

## 2013-11-16 MED ORDER — TRIAMTERENE-HCTZ 75-50 MG PO TABS: 1.0000 | ORAL_TABLET | Freq: Every day | ORAL | Status: DC

## 2013-11-16 NOTE — Patient Instructions (Addendum)
Call us as soon as know when your insurance starts to schedule follow up with cxr and PFT's and dermatology referral (so do not pick the high deductible plan)

## 2013-11-16 NOTE — Progress Notes (Signed)
Subjective:     Patient ID: Kenneth Delacruz, male   DOB: February 11, 1976   MRN: [id number]     Brief patient profile:   35  yobm quit smoking smoking 03/2009 dx with sarcoid 2006 cough wt loss sob, rx prednisone and eventually tapered it off in 2007    History of Present Illness  June 08, 2009 after hosp in May 2010 at [facility] cough with deep breath and sob with activity, feels just like his sarcoid. rec prednisone 10 2 each am until better then taper to one half   July 21, 2009 ov 100% except doe x exertion so decreased to 10 mg one half daily.   October 18, 2009 cc his breathing has overall been okay. Pt taking 5 mg of prednisone qd. No complaints of aches/ pains/ rash/sob /coough/ocular co's. rec try pred 5 mg every other day then return for f/u, did not return as rec.   May 24, 2010 ov Followup sarcoid. finished prednisone rx around 01/2010 breathing did fine off it completely. > maintain on 5 mg qod   04/25/2011 ov/Kenneth Delacruz cc doe heavy ex only. No ocular or articular symptoms, rash or cough off prednisone x 2 weeks when ran out without flare of any of the previous symptoms. rec The lowest dose of prednisone is always the best dose as long as it works  Try off prednisone completely and if the same problem comes back start back on prednisone 10 mg per day until back to normal, then one half x two weeks and then one half on odd days until seen every 3 months     09/11/2011 f/u ov/Kenneth Delacruz cc increased SOB and prod cough with clear sputum for approx 10 days - this occurred when he stopped prednisone 2 weeks ago. >restarted prednisone    04/01/2012 Acute OV  Complains of increased SOB, wheezing, dry cough x3-4weeks, since restarted smoking Ran out of prednisone 3 weeks ago.  Under a lot of stress, wife passed last month (complications of Lupus )  Started back smoking. We discussed smoking cesstation  rec Restart  Prednisone 10 mg per day until back to normal, then  one half x two weeks and then one  half every other day -hold at this dose  if condition worsens go back to 10 mg per day and start taper over.  Please contact office for sooner follow up if symptoms do not improve or worsen or seek emergency care  Work on stopping smoking  We are very sorry for your loss   12/25/2012 f/u ov/Kenneth Delacruz prednisone down to 5 mg qod then increased to 10 mg per day x 6 weeks due to tightness/ cough ? Also maybe related to resuming smoking with loss of wife.  No rash or arthralgias.  rec Prednisone ceiling is 10 mg day and floor is 5 mg every other day Please schedule a follow up office visit in 6 weeks, call sooner if needed PFT's and CxR > not done    11/16/2013 f/u ov/Kenneth Delacruz resumed smoing e:  Sarcoid/ hbp Chief Complaint  Patient presents with  . Follow-up    Pt c/o rt side CP- when takes in a deep breath or turns a certain way x 2 wks. He also reports slight increase in DOE for the past couple wks.   Floor of 5 mg qod  Most most the time works  Cp identical to prev flares bilateral R > L flanks, positional and worse worse with deep breath   No obvious day  to day or daytime variabilty or assoc  Cough chest tightness, subjective wheeze overt sinus or hb symptoms. No unusual exp hx or h/o childhood pna/ asthma or knowledge of premature birth.  Sleeping ok without nocturnal  or early am exacerbation  of respiratory  c/o's or need for noct saba. Also denies any obvious fluctuation of symptoms with weather or environmental changes or other aggravating or alleviating factors except as outlined above   Current Medications, Allergies, Complete Past Medical History, Past Surgical History, Family History, and Social History were reviewed in [employer] record.  ROS  The following are not active complaints unless bolded sore throat, dysphagia, dental problems, itching, sneezing,  nasal congestion or excess/ purulent secretions, ear ache,   fever, chills, sweats, unintended wt loss,    exertional cp, hemoptysis,  orthopnea pnd or leg swelling, presyncope, palpitations, heartburn, abdominal pain, anorexia, nausea, vomiting, diarrhea  or change in bowel or urinary habits, change in stools or urine, dysuria,hematuria,  rash, arthralgias, visual complaints, headache, numbness weakness or ataxia or problems with walking or coordination,  change in mood/affect or memory.          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 Nl external ear canals without cough reflex   NECK :  without JVD/Nodes/TM/ nl carotid upstrokes bilaterally   LUNGS: no acc muscle use, clear to A and P bilaterally without cough on insp or exp maneuvers   CV:  RRR  no s3 or murmur or increase in P2, no edema   ABD:  soft and nontender with nl excursion in the supine position. No bruits or organomegaly, bowel sounds nl  MS:  warm without deformities, calf tenderness, cyanosis or clubbing          No recent cxr on file, declines due to lack of insurance     Plan:

## 2013-11-17 ENCOUNTER — Encounter: Payer: Self-pay | Admitting: Internal Medicine

## 2013-11-17 NOTE — Assessment & Plan Note (Signed)
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  For now therefore I recommend the patient maintain  A ceiling of 10 mg per day and a floor of 5 mg qod  Needs to update pfts and cxr but wants to wait until he has insurance through new job p Jan 2015

## 2013-11-17 NOTE — Assessment & Plan Note (Signed)
Marginally Adequate control on present rx, reviewed > no change in rx needed   

## 2013-11-17 NOTE — Assessment & Plan Note (Signed)
Strongly advised to quit.

## 2014-02-22 ENCOUNTER — Inpatient Hospital Stay (HOSPITAL_COMMUNITY)
Admission: EM | Admit: 2014-02-22 | Discharge: 2014-02-25 | DRG: 280 | Disposition: A | Discharge status: Home / Self Care | Payer: Medicaid Other | Attending: Pulmonary Disease | Admitting: Pulmonary Disease

## 2014-02-22 ENCOUNTER — Emergency Department (HOSPITAL_COMMUNITY): Payer: Medicaid Other

## 2014-02-22 ENCOUNTER — Encounter (HOSPITAL_COMMUNITY): Payer: Self-pay | Admitting: Emergency Medicine

## 2014-02-22 ENCOUNTER — Inpatient Hospital Stay (HOSPITAL_COMMUNITY): Payer: Medicaid Other

## 2014-02-22 DIAGNOSIS — Z79899 Other long term (current) drug therapy: Secondary | ICD-10-CM | POA: Diagnosis not present

## 2014-02-22 DIAGNOSIS — I214 Non-ST elevation (NSTEMI) myocardial infarction: Principal | ICD-10-CM | POA: Diagnosis present

## 2014-02-22 DIAGNOSIS — J159 Unspecified bacterial pneumonia: Secondary | ICD-10-CM | POA: Diagnosis present

## 2014-02-22 DIAGNOSIS — R000 Tachycardia, unspecified: Secondary | ICD-10-CM | POA: Diagnosis present

## 2014-02-22 DIAGNOSIS — R0902 Hypoxemia: Secondary | ICD-10-CM | POA: Diagnosis present

## 2014-02-22 DIAGNOSIS — R7881 Bacteremia: Secondary | ICD-10-CM | POA: Diagnosis present

## 2014-02-22 DIAGNOSIS — I10 Essential (primary) hypertension: Secondary | ICD-10-CM | POA: Diagnosis present

## 2014-02-22 DIAGNOSIS — D869 Sarcoidosis, unspecified: Secondary | ICD-10-CM | POA: Diagnosis present

## 2014-02-22 DIAGNOSIS — F17200 Nicotine dependence, unspecified, uncomplicated: Secondary | ICD-10-CM | POA: Diagnosis present

## 2014-02-22 DIAGNOSIS — IMO0002 Reserved for concepts with insufficient information to code with codable children: Secondary | ICD-10-CM | POA: Diagnosis not present

## 2014-02-22 DIAGNOSIS — R0682 Tachypnea, not elsewhere classified: Secondary | ICD-10-CM | POA: Diagnosis present

## 2014-02-22 DIAGNOSIS — J99 Respiratory disorders in diseases classified elsewhere: Secondary | ICD-10-CM | POA: Diagnosis present

## 2014-02-22 DIAGNOSIS — R0602 Shortness of breath: Secondary | ICD-10-CM | POA: Diagnosis not present

## 2014-02-22 DIAGNOSIS — J189 Pneumonia, unspecified organism: Secondary | ICD-10-CM

## 2014-02-22 LAB — I-STAT TROPONIN, ED: Troponin i, poc: 0.13 ng/mL (ref 0.00–0.08)

## 2014-02-22 LAB — CBC WITH DIFFERENTIAL/PLATELET
Basophils Absolute: 0 10*3/uL (ref 0.0–0.1)
Basophils Relative: 0 % (ref 0–1)
EOS ABS: 0 10*3/uL (ref 0.0–0.7)
EOS PCT: 0 % (ref 0–5)
HEMATOCRIT: 41 % (ref 39.0–52.0)
Hemoglobin: 14.3 g/dL (ref 13.0–17.0)
Lymphocytes Relative: 12 % (ref 12–46)
Lymphs Abs: 1 10*3/uL (ref 0.7–4.0)
MCH: 30.2 pg (ref 26.0–34.0)
MCHC: 34.9 g/dL (ref 30.0–36.0)
MCV: 86.7 fL (ref 78.0–100.0)
MONO ABS: 0.5 10*3/uL (ref 0.1–1.0)
Monocytes Relative: 6 % (ref 3–12)
Neutro Abs: 6.9 10*3/uL (ref 1.7–7.7)
Neutrophils Relative %: 82 % — ABNORMAL HIGH (ref 43–77)
Platelets: 279 10*3/uL (ref 150–400)
RBC: 4.73 MIL/uL (ref 4.22–5.81)
RDW: 14.4 % (ref 11.5–15.5)
WBC: 8.5 10*3/uL (ref 4.0–10.5)

## 2014-02-22 LAB — BASIC METABOLIC PANEL
BUN: 16 mg/dL (ref 6–23)
CALCIUM: 8.6 mg/dL (ref 8.4–10.5)
CO2: 23 meq/L (ref 19–32)
CREATININE: 1.03 mg/dL (ref 0.50–1.35)
Chloride: 105 mEq/L (ref 96–112)
GFR calc Af Amer: >90 mL/min (ref >90)
GFR calc non Af Amer: >90 mL/min (ref >90)
GLUCOSE: 124 mg/dL — AB (ref 70–99)
Potassium: 4 mEq/L (ref 3.7–5.3)
Sodium: 142 mEq/L (ref 137–147)

## 2014-02-22 LAB — I-STAT ARTERIAL BLOOD GAS, ED
BICARBONATE: 23.3 meq/L (ref 20.0–24.0)
O2 Saturation: 93 %
PH ART: 7.44 (ref 7.350–7.450)
Patient temperature: 98.6
TCO2: 24 mmol/L (ref 0–100)
pCO2 arterial: 34.3 mmHg — ABNORMAL LOW (ref 35.0–45.0)
pO2, Arterial: 64 mmHg — ABNORMAL LOW (ref 80.0–100.0)

## 2014-02-22 LAB — TROPONIN I
TROPONIN I: 1.42 ng/mL — AB (ref <0.30)
Troponin I: 0.84 ng/mL (ref <0.30)
Troponin I: 1.36 ng/mL (ref <0.30)

## 2014-02-22 LAB — RAPID URINE DRUG SCREEN, HOSP PERFORMED
Amphetamines: NOT DETECTED
BARBITURATES: NOT DETECTED
BENZODIAZEPINES: NOT DETECTED
COCAINE: NOT DETECTED
Opiates: POSITIVE — AB
Tetrahydrocannabinol: POSITIVE — AB

## 2014-02-22 LAB — I-STAT CHEM 8, ED
BUN: 20 mg/dL (ref 6–23)
CALCIUM ION: 1.12 mmol/L (ref 1.12–1.23)
CREATININE: 1.5 mg/dL — AB (ref 0.50–1.35)
Chloride: 102 mEq/L (ref 96–112)
Glucose, Bld: 113 mg/dL — ABNORMAL HIGH (ref 70–99)
HCT: 44 % (ref 39.0–52.0)
HEMOGLOBIN: 15 g/dL (ref 13.0–17.0)
Potassium: 3.2 mEq/L — ABNORMAL LOW (ref 3.7–5.3)
SODIUM: 138 meq/L (ref 137–147)
TCO2: 24 mmol/L (ref 0–100)

## 2014-02-22 LAB — PHOSPHORUS: Phosphorus: 2.8 mg/dL (ref 2.3–4.6)

## 2014-02-22 LAB — STREP PNEUMONIAE URINARY ANTIGEN: STREP PNEUMO URINARY ANTIGEN: NEGATIVE

## 2014-02-22 LAB — MAGNESIUM: MAGNESIUM: 1.8 mg/dL (ref 1.5–2.5)

## 2014-02-22 LAB — LACTIC ACID, PLASMA: LACTIC ACID, VENOUS: 1 mmol/L (ref 0.5–2.2)

## 2014-02-22 MED ORDER — IPRATROPIUM BROMIDE 0.02 % IN SOLN
0.5000 mg | Freq: Four times a day (QID) | RESPIRATORY_TRACT | Status: DC
  Administered 2014-02-22 (×3): 0.5 mg via RESPIRATORY_TRACT
  Filled 2014-02-22 (×3): qty 2.5

## 2014-02-22 MED ORDER — SODIUM CHLORIDE 0.9 % IV BOLUS (SEPSIS)
1000.0000 mL | Freq: Once | INTRAVENOUS | Status: AC
  Administered 2014-02-22: 1000 mL via INTRAVENOUS

## 2014-02-22 MED ORDER — PREDNISONE 20 MG PO TABS
40.0000 mg | ORAL_TABLET | Freq: Every day | ORAL | Status: DC
  Administered 2014-02-22 – 2014-02-25 (×4): 40 mg via ORAL
  Filled 2014-02-22 (×5): qty 2

## 2014-02-22 MED ORDER — IPRATROPIUM BROMIDE 0.02 % IN SOLN: 0.5000 mg | RESPIRATORY_TRACT | Status: DC | PRN

## 2014-02-22 MED ORDER — VANCOMYCIN HCL IN DEXTROSE 1-5 GM/200ML-% IV SOLN
1000.0000 mg | Freq: Once | INTRAVENOUS | Status: DC
  Administered 2014-02-22: 1000 mg via INTRAVENOUS
  Filled 2014-02-22: qty 200

## 2014-02-22 MED ORDER — ALBUTEROL SULFATE (2.5 MG/3ML) 0.083% IN NEBU
2.5000 mg | INHALATION_SOLUTION | Freq: Once | RESPIRATORY_TRACT | Status: AC
  Administered 2014-02-22: 2.5 mg via RESPIRATORY_TRACT
  Filled 2014-02-22: qty 3

## 2014-02-22 MED ORDER — POTASSIUM CHLORIDE 10 MEQ/100ML IV SOLN
10.0000 meq | INTRAVENOUS | Status: AC
  Administered 2014-02-22 (×2): 10 meq via INTRAVENOUS
  Filled 2014-02-22 (×3): qty 100

## 2014-02-22 MED ORDER — DEXTROSE 5 % IV SOLN
1.0000 g | INTRAVENOUS | Status: DC
  Administered 2014-02-22 – 2014-02-23 (×2): 1 g via INTRAVENOUS
  Filled 2014-02-22 (×2): qty 10

## 2014-02-22 MED ORDER — IBUPROFEN 400 MG PO TABS
600.0000 mg | ORAL_TABLET | Freq: Once | ORAL | Status: AC
  Administered 2014-02-22: 600 mg via ORAL
  Filled 2014-02-22 (×2): qty 1

## 2014-02-22 MED ORDER — HEPARIN SODIUM (PORCINE) 5000 UNIT/ML IJ SOLN
5000.0000 [IU] | Freq: Three times a day (TID) | INTRAMUSCULAR | Status: DC
  Administered 2014-02-22: 5000 [IU] via SUBCUTANEOUS
  Filled 2014-02-22 (×13): qty 1

## 2014-02-22 MED ORDER — ALBUTEROL SULFATE (2.5 MG/3ML) 0.083% IN NEBU
2.5000 mg | INHALATION_SOLUTION | RESPIRATORY_TRACT | Status: DC | PRN
  Administered 2014-02-22 – 2014-02-24 (×2): 2.5 mg via RESPIRATORY_TRACT
  Filled 2014-02-22 (×2): qty 3

## 2014-02-22 MED ORDER — ASPIRIN EC 81 MG PO TBEC
81.0000 mg | DELAYED_RELEASE_TABLET | Freq: Every day | ORAL | Status: DC
  Administered 2014-02-23 – 2014-02-25 (×3): 81 mg via ORAL
  Filled 2014-02-22 (×4): qty 1

## 2014-02-22 MED ORDER — ACETAMINOPHEN 500 MG PO TABS
1000.0000 mg | ORAL_TABLET | Freq: Once | ORAL | Status: AC
  Administered 2014-02-22: 1000 mg via ORAL
  Filled 2014-02-22: qty 2

## 2014-02-22 MED ORDER — HYDROCOD POLST-CHLORPHEN POLST 10-8 MG/5ML PO LQCR
5.0000 mL | Freq: Two times a day (BID) | ORAL | Status: DC | PRN
  Administered 2014-02-22 – 2014-02-25 (×5): 5 mL via ORAL
  Filled 2014-02-22 (×5): qty 5

## 2014-02-22 MED ORDER — AZITHROMYCIN 250 MG PO TABS
250.0000 mg | ORAL_TABLET | Freq: Every day | ORAL | Status: DC
  Administered 2014-02-23 – 2014-02-25 (×3): 250 mg via ORAL
  Filled 2014-02-22 (×3): qty 1

## 2014-02-22 NOTE — Progress Notes (Signed)
CRITICAL VALUE ALERT  Critical value received:  Trop 0.81   Date of notification:  02/22/2014  Time of notification:  1053  Critical value read back:yes  Nurse who received alert:  Ovidio Hangerchasidy Magenta Schmiesing, rn  MD notified (1st page):  Danford BadKathryn whiteheart, np  Time of first page:  [phone]

## 2014-02-22 NOTE — Progress Notes (Signed)
UR Completed Jama Krichbaum Graves-Bigelow, RN,BSN [phone]  

## 2014-02-22 NOTE — ED Notes (Signed)
Dr Nicanor AlconPalumbo notified of troponin results

## 2014-02-22 NOTE — Progress Notes (Signed)
VASCULAR LAB PRELIMINARY  PRELIMINARY  PRELIMINARY  PRELIMINARY  Bilateral lower extremity venous duplex  completed.    Preliminary report:  Bilateral:  No evidence of DVT, superficial thrombosis, or Baker's Cyst.    Stepfon Rawles, RVT 02/22/2014, 4:18 PM

## 2014-02-22 NOTE — ED Notes (Signed)
Pt returned from CT °

## 2014-02-22 NOTE — ED Provider Notes (Signed)
CSN: [id number]     Arrival date & time 02/22/14  0340 History   First MD Initiated Contact with Patient 02/22/14 [phone]     Chief Complaint  Patient presents with  . Shortness of Breath     (Consider location/radiation/quality/duration/timing/severity/associated sxs/prior Treatment) Patient is a 38 y.o. male presenting with shortness of breath. The history is provided by the patient.  Shortness of Breath Severity:  Moderate Onset quality:  Gradual Duration:  1 day Timing:  Constant Progression:  Worsening Chronicity:  Recurrent Context: not occupational exposure   Relieved by:  Nothing Worsened by:  Nothing tried Ineffective treatments:  None tried Associated symptoms: cough, fever and wheezing   Associated symptoms: no abdominal pain and no chest pain   Fever:    Timing:  Constant   Max temp PTA (F):  104   Temp source:  Oral   Progression:  Unchanged Risk factors: tobacco use     Past Medical History  Diagnosis Date  . Pneumonia   . Pulmonary sarcoidosis   . Weight loss   . HTN (hypertension)    Past Surgical History  Procedure Laterality Date  . Transbronchial biopsy  3.8.07   Family History  Problem Relation Age of Onset  . Asthma Mother    History  Substance Use Topics  . Smoking status: Current Every Day Smoker -- 0.30 packs/day    Types: Cigarettes  . Smokeless tobacco: Never Used  . Alcohol Use: 0.5 oz/week    1 drink(s) per week    Review of Systems  Constitutional: Positive for fever.  Respiratory: Positive for cough, shortness of breath and wheezing. Negative for chest tightness and stridor.   Cardiovascular: Negative for chest pain, palpitations and leg swelling.  Gastrointestinal: Negative for abdominal pain.  All other systems reviewed and are negative.      Allergies  Review of patient's allergies indicates no known allergies.  Home Medications   Current Outpatient Rx  Name  Route  Sig  Dispense  Refill  . predniSONE (DELTASONE)  10 MG tablet   Oral   Take 10 mg by mouth daily with breakfast.          . triamterene-hydrochlorothiazide (MAXZIDE) 75-50 MG per tablet   Oral   Take 1 tablet by mouth daily.          BP 159/70  Pulse 120  Temp(Src) 103.1 F (39.5 C) (Oral)  Resp 38  SpO2 86% Physical Exam  Constitutional: He is oriented to person, place, and time. He appears well-developed and well-nourished.  HENT:  Head: Normocephalic and atraumatic.  Mouth/Throat: Oropharynx is clear and moist.  Eyes: Conjunctivae are normal. Pupils are equal, round, and reactive to light.  Neck: Normal range of motion. Neck supple.  Cardiovascular: Regular rhythm and intact distal pulses.  Tachycardia present.   Pulmonary/Chest: Tachypnea noted. He has decreased breath sounds. He has wheezes. He has no rales.  Abdominal: Soft. Bowel sounds are normal. There is no tenderness. There is no rebound and no guarding.  Musculoskeletal: Normal range of motion.  Lymphadenopathy:    He has no cervical adenopathy.  Neurological: He is alert and oriented to person, place, and time.  Skin: Skin is warm and dry. He is not diaphoretic.  Psychiatric: He has a normal mood and affect.    ED Course  Procedures (including critical care time) Labs Review Labs Reviewed  CBC WITH DIFFERENTIAL  I-STAT CHEM 8, ED  I-STAT TROPOININ, ED   Imaging Review No results found.  EKG Interpretation   Date/Time:  Monday February 22 2014 03:46:45 EDT Ventricular Rate:  121 PR Interval:  154 QRS Duration: 99 QT Interval:  337 QTC Calculation: 478 R Axis:   12 Text Interpretation:  Sinus tachycardia LVH with secondary repolarization  abnormality Confirmed by [facility]  MD, Morene Antu ([id number]) on 02/22/2014  4:05:54 AM      MDM   Final diagnoses:  None    MDM Reviewed: previous chart, nursing note and vitals Reviewed previous: ECG Interpretation: labs, ECG and x-ray (no elevated white count elevated BUN and Cr and elevated troponin,  PNA on CXR) Consults: pulmonary   Medications  sodium chloride 0.9 % bolus 1,000 mL (not administered)  vancomycin (VANCOCIN) IVPB 1000 mg/200 mL premix (not administered)  acetaminophen (TYLENOL) tablet 1,000 mg (1,000 mg Oral Given 02/22/14 0356)  ibuprofen (ADVIL,MOTRIN) tablet 600 mg (600 mg Oral Given 02/22/14 0356)  albuterol (PROVENTIL) (2.5 MG/3ML) 0.083% nebulizer solution 2.5 mg (2.5 mg Nebulization Given 02/22/14 0400)  sodium chloride 0.9 % bolus 1,000 mL (1,000 mLs Intravenous New Bag/Given 02/22/14 0527)  sodium chloride 0.9 % bolus 1,000 mL (1,000 mLs Intravenous New Bag/Given 02/22/14 0522)   Results for orders placed during the hospital encounter of 02/22/14  CBC WITH DIFFERENTIAL      Result Value Ref Range   WBC 8.5  4.0 - 10.5 K/uL   RBC 4.73  4.22 - 5.81 MIL/uL   Hemoglobin 14.3  13.0 - 17.0 g/dL   HCT 60.4  54.0 - 98.1 %   MCV 86.7  78.0 - 100.0 fL   MCH 30.2  26.0 - 34.0 pg   MCHC 34.9  30.0 - 36.0 g/dL   RDW 19.1  47.8 - 29.5 %   Platelets 279  150 - 400 K/uL   Neutrophils Relative % 82 (*) 43 - 77 %   Neutro Abs 6.9  1.7 - 7.7 K/uL   Lymphocytes Relative 12  12 - 46 %   Lymphs Abs 1.0  0.7 - 4.0 K/uL   Monocytes Relative 6  3 - 12 %   Monocytes Absolute 0.5  0.1 - 1.0 K/uL   Eosinophils Relative 0  0 - 5 %   Eosinophils Absolute 0.0  0.0 - 0.7 K/uL   Basophils Relative 0  0 - 1 %   Basophils Absolute 0.0  0.0 - 0.1 K/uL  URINE RAPID DRUG SCREEN (HOSP PERFORMED)      Result Value Ref Range   Opiates POSITIVE (*) NONE DETECTED   Cocaine NONE DETECTED  NONE DETECTED   Benzodiazepines NONE DETECTED  NONE DETECTED   Amphetamines NONE DETECTED  NONE DETECTED   Tetrahydrocannabinol POSITIVE (*) NONE DETECTED   Barbiturates NONE DETECTED  NONE DETECTED  I-STAT CHEM 8, ED      Result Value Ref Range   Sodium 138  137 - 147 mEq/L   Potassium 3.2 (*) 3.7 - 5.3 mEq/L   Chloride 102  96 - 112 mEq/L   BUN 20  6 - 23 mg/dL   Creatinine, Ser 6.21 (*) 0.50 - 1.35  mg/dL   Glucose, Bld 308 (*) 70 - 99 mg/dL   Calcium, Ion 6.57  8.46 - 1.23 mmol/L   TCO2 24  0 - 100 mmol/L   Hemoglobin 15.0  13.0 - 17.0 g/dL   HCT 96.2  95.2 - 84.1 %  I-STAT TROPOININ, ED      Result Value Ref Range   Troponin i, poc 0.13 (*) 0.00 - 0.08 ng/mL  Comment NOTIFIED PHYSICIAN     Comment 3           I-STAT ARTERIAL BLOOD GAS, ED      Result Value Ref Range   pH, Arterial 7.440  7.350 - 7.450   pCO2 arterial 34.3 (*) 35.0 - 45.0 mmHg   pO2, Arterial 64.0 (*) 80.0 - 100.0 mmHg   Bicarbonate 23.3  20.0 - 24.0 mEq/L   TCO2 24  0 - 100 mmol/L   O2 Saturation 93.0     Patient temperature 98.6 F     Collection site RADIAL, ALLEN'S TEST ACCEPTABLE     Drawn by Operator     Sample type ARTERIAL     Dg Chest 2 View  02/22/2014   CLINICAL DATA:  Shortness of breath, history of sarcoidosis.  EXAM: CHEST  2 VIEW  COMPARISON:  DG CHEST 2 VIEW dated 04/25/2011  FINDINGS: Patchy alveolar airspace opacities in the left lung. No pleural effusions.  Cardiac silhouette is upper limits of normal in size, mediastinal silhouette is nonsuspicious. Trachea projects midline and there is no pneumothorax. Soft tissue planes and included osseous structures are nonsuspicious. Multiple EKG lines overlie the patient and may obscure subtle underlying pathology.  IMPRESSION: Patchy alveolar airspace opacities in the left lung concerning for pneumonia. Borderline cardiomegaly. Recommend followup chest radiograph after treatment to verify improvement.   Electronically Signed   By: Awilda Metro   On: 02/22/2014 05:01    CRITICAL CARE Performed by: Jasmine Awe Total critical care time: 61 minutes Critical care time was exclusive of separately billable procedures and treating other patients. Critical care was necessary to treat or prevent imminent or life-threatening deterioration. Critical care was time spent personally by me on the following activities: development of treatment plan with  patient and/or surrogate as well as nursing, discussions with consultants, evaluation of patient's response to treatment, examination of patient, obtaining history from patient or surrogate, ordering and performing treatments and interventions, ordering and review of laboratory studies, ordering and review of radiographic studies, pulse oximetry and re-evaluation of patient's condition.    Jasmine Awe, MD 02/22/14 [phone]

## 2014-02-22 NOTE — Care Management Note (Unsigned)
    Page 1 of 1   02/22/2014     3:11:08 PM   CARE MANAGEMENT NOTE 02/22/2014  Patient:  Kenneth Delacruz,Kenneth Delacruz   Account Number:  [id number]  Date Initiated:  02/22/2014  Documentation initiated by:  GRAVES-BIGELOW,Lucillie Kiesel  Subjective/Objective Assessment:   Pt admitted for hypoxia.     Action/Plan:   CM will continue to monitor for disposition needs.   Anticipated DC Date:  02/24/2014   Anticipated DC Plan:  HOME/SELF CARE      DC Planning Services  CM consult      Choice offered to / List presented to:             Status of service:  In process, will continue to follow Medicare Important Message given?   (If response is "NO", the following Medicare IM given date fields will be blank) Date Medicare IM given:   Date Additional Medicare IM given:    Discharge Disposition:    Per UR Regulation:  Reviewed for med. necessity/level of care/duration of stay  If discussed at Long Length of Stay Meetings, dates discussed:    Comments:

## 2014-02-22 NOTE — H&P (Signed)
PULMONARY  / CRITICAL CARE MEDICINE  Name: Kenneth Delacruz MRN: [id number] DOB: 05-Jun-1976    ADMISSION DATE:  02/22/2014 CONSULTATION DATE:  3/23  REFERRING MD :  ED PRIMARY SERVICE: Pulmonary CCM  CHIEF COMPLAINT:  Acute SOB and cough  BRIEF PATIENT DESCRIPTION: 38 yo male with long history of sarcoidosis who is on 10 mg of prednisone daily, presents with abrupt onset of dry cough that woke him from sleep.  Presentation to the ED with initial evaluation showing SpO2 <85%, significant tachycardia, and a temp of 103.1.  SIGNIFICANT EVENTS / STUDIES:  CXR IMPRESSION:  Patchy alveolar airspace opacities in the left lung concerning for  pneumonia. Borderline cardiomegaly. Recommend followup chest  radiograph after treatment to verify improvement.   LINES / TUBES: none  CULTURES: Blood x2  ANTIBIOTICS: Vanc/Zosyn in ED Changed to Ceftriaxone/Azithromycin 3/23  HISTORY OF PRESENT ILLNESS:   38 yo male with long history of sarcoidosis who is on 10 mg of prednisone daily, presents with abrupt onset of dry cough that woke him from sleep.  Presentation to the ED with initial evaluation showing SpO2 <85%, significant tachycardia, and a temp of 103.1.  Patient generally takes 10 mg of prednisone daily, missed his dose yesterday.  Continues to smoke cigarettes (1/2 pack per week).  Sick contacts at work and girlfriend with tonsillitis.  States his prednisone regimen is 5mg  a day to be increased for symptoms and reports he has been taking 10mg  for the last couple of months.  Last night, awoke from sleep with a dry cough, unable to stop, with significant shortness of breath.  Denies feeling febrile, nauseated, diarrhea, hemoptysis, purulent sputum production, rash, joint pains, skin changes, sore throat, syncope, poor appetite, weakness, headache, vision changes, abdominal pain.  At ED febrile and hypoxic.  Denies chest pain, jaw pain, arm pain.  Placed on 2 L [place], with SpO2 >95% following  albuterol nebulizer.    At bedside, patient tachypneic in the 30's (but appears relaxed), resolving diaphoresis, and tachycardic >120's.  Has stopped coughing, but rhonchi and wheezes noted in lingula on exam.  PMH Sarcoid, HTN  PAST MEDICAL HISTORY :  Past Medical History  Diagnosis Date  . Pneumonia   . Pulmonary sarcoidosis   . Weight loss   . HTN (hypertension)    Past Surgical History  Procedure Laterality Date  . Transbronchial biopsy  3.8.07   Prior to Admission medications   Medication Sig Start Date End Date Taking? Authorizing Provider  predniSONE (DELTASONE) 10 MG tablet Take 10 mg by mouth daily with breakfast.  11/16/13  Yes Nyoka Cowden, MD  triamterene-hydrochlorothiazide (MAXZIDE) 75-50 MG per tablet Take 1 tablet by mouth daily. 11/16/13  Yes Nyoka Cowden, MD   No Known Allergies  FAMILY HISTORY:  Family History  Problem Relation Age of Onset  . Asthma Mother    SOCIAL HISTORY:  reports that he has been smoking Cigarettes.  He has been smoking about 0.30 packs per day. He has never used smokeless tobacco. He reports that he drinks about 0.5 ounces of alcohol per week. He reports that he does not use illicit drugs. Smokes 1/2 pack per week currently Positive for marijuana and narcotics   REVIEW OF SYSTEMS:  Full ROS completed, negative unless noted above  SUBJECTIVE:   VITAL SIGNS: Temp:  [101.9 F (38.8 C)-103.1 F (39.5 C)] 101.9 F (38.8 C) (03/23 0603) Pulse Rate:  [120-124] 124 (03/23 0603) Resp:  [27-40] 27 (03/23 0603) BP: (60-159)/(30-80) 138/47 mmHg (  03/23 0603) SpO2:  [86 %-96 %] 96 % (03/23 0603) HEMODYNAMICS:   VENTILATOR SETTINGS:   INTAKE / OUTPUT: Intake/Output     03/22 0701 - 03/23 0700   I.V. 1000   Total Intake 1000   Net +1000         PHYSICAL EXAMINATION: General:  Diaphoretic, febrile at 101.9, NAD Neuro:  Moves all extremities, no focal deficits, AAOx4 HEENT:  Oral mucosa pink and moist, no conjunctival pallor,  no icterus, EOMI Cardiovascular:  Tachycardic, no MRG, normal S1s2 Lungs:  Right side clear, left side with significant decrease of air moment, noted rhonchi in lingula  Abdomen:  Non tender, non distended, normal bowel sounds Musculoskeletal:  No tenderness Skin:  No rashes, warm and dry  LABS:  Recent Labs Lab 02/22/14 0413 02/22/14 0423 02/22/14 0541  HGB 14.3 15.0  --   WBC 8.5  --   --   PLT 279  --   --   NA  --  138  --   K  --  3.2*  --   CL  --  102  --   GLUCOSE  --  113*  --   BUN  --  20  --   CREATININE  --  1.50*  --   PHART  --   --  7.440  PCO2ART  --   --  34.3*  PO2ART  --   --  64.0*   No results found for this basename: GLUCAP,  in the last 168 hours  CXR: noted left sided opacity   ASSESSMENT / PLAN:  PULMONARY A:  Acute pulmonary exacerbation of Sarcoidosis versus CAP Patient with significant fever, hypoxia, dyspnea on arrival CXR with opacity on left not on prior CXR No leukocytosis P:   Treating for CAP with Ceftriaxone and Azithromycin Unlikely PJP as patient on 10 mg of Prednisone Increasing Prednisone to 40mg  oral, first dose now CT stat for further delineation of disease process, sarcoid progression  Smoking cessation and marijuana cessation will need to be performed   CARDIOVASCULAR A:   Elevated troponin Likely stress response with hypoxia, asymptomatic for ACS P:  Trend for further evaluation No acute intervention  A:  Tachycardia Likely due to fever Debated CTA for PE evaluation, with hypoxia, tachycardia, and elevated troponin.  Low Wells score with alternative diagnosis more probable.  If troponin and tachycardia do not decrease, may need further evaluation.  Patient without pleuritic chest pain, hemoptysis, EKG not suggestive of, and sob is resolving with current treatment Place on telemetry   RENAL A:  Monitor electrolytes P:   Replacing K IV 20 meq Likely due to albuterol treatment, or at least exacerbated by, repeat  bmp at noon  GASTROINTESTINAL A:  Diet P:   Regular diet started  HEMATOLOGIC A:  DVT prophylaxis  P:  Heparin tid  INFECTIOUS A:  CAP P:   See above  ENDOCRINE A:  Glycemic monitoring   P:     NEUROLOGIC A:  N/A P:    Admission to floor with CAP and possible APES with plans for CT for further evaluation.  Requesting telemetery  [facility]ulmonary and Critical Care Medicine [facility] Pager: [phone]  02/22/2014, 6:29 AM

## 2014-02-22 NOTE — ED Notes (Addendum)
Pt arrives c/o shortness of breath since yesterday. States that he missed one dose of prednisone and his BP meds.

## 2014-02-22 NOTE — ED Notes (Signed)
Pt transported to CT ?

## 2014-02-22 NOTE — Progress Notes (Signed)
Reviewed.  Pama Roskos, MD Pulmonary and Critical Care Medicine [facility] Pager: [phone]  

## 2014-02-22 NOTE — ED Notes (Signed)
Patient transported to CT 

## 2014-02-22 NOTE — Progress Notes (Signed)
Brief admission f/u.   Pt admitted this am with dyspnea, hypoxia - exacerbation of sarcoid v CAP.  Rx with steroids, rocephin/azithro.  CT chest done - ?atypical infection v exacerbation sarcoid.  Pt has had sick contact (girlfriend with URI, tonsillitis).  Pt is feeling much improved, up walking to bathroom but still with cough and SOB above baseline.   Plan -  Cont IV abx Urine strep/legionella  O2 as needed  Cont prednisone at increased dose  Noon bmet and Am labs  F/u CXR  BLE dopplers  Smoking cessation  Consider CTA to r/o PE with increasing troponin - no s/s ACS  Danae Oland, NP 02/22/2014  10:07 AM Pager: [phone] or [phone]

## 2014-02-23 ENCOUNTER — Encounter (HOSPITAL_COMMUNITY): Payer: Self-pay | Admitting: Physician Assistant

## 2014-02-23 DIAGNOSIS — D869 Sarcoidosis, unspecified: Secondary | ICD-10-CM

## 2014-02-23 DIAGNOSIS — F17200 Nicotine dependence, unspecified, uncomplicated: Secondary | ICD-10-CM

## 2014-02-23 DIAGNOSIS — I319 Disease of pericardium, unspecified: Secondary | ICD-10-CM

## 2014-02-23 DIAGNOSIS — J189 Pneumonia, unspecified organism: Secondary | ICD-10-CM | POA: Diagnosis present

## 2014-02-23 DIAGNOSIS — I214 Non-ST elevation (NSTEMI) myocardial infarction: Secondary | ICD-10-CM

## 2014-02-23 LAB — LEGIONELLA ANTIGEN, URINE: Legionella Antigen, Urine: NEGATIVE

## 2014-02-23 LAB — BASIC METABOLIC PANEL
BUN: 14 mg/dL (ref 6–23)
CALCIUM: 8.2 mg/dL — AB (ref 8.4–10.5)
CO2: 25 mEq/L (ref 19–32)
Chloride: 103 mEq/L (ref 96–112)
Creatinine, Ser: 1.28 mg/dL (ref 0.50–1.35)
GFR calc non Af Amer: 70 mL/min — ABNORMAL LOW (ref >90)
GFR, EST AFRICAN AMERICAN: 81 mL/min — AB (ref >90)
GLUCOSE: 100 mg/dL — AB (ref 70–99)
Potassium: 3.7 mEq/L (ref 3.7–5.3)
SODIUM: 141 meq/L (ref 137–147)

## 2014-02-23 LAB — CBC
HCT: 38.5 % — ABNORMAL LOW (ref 39.0–52.0)
Hemoglobin: 13.3 g/dL (ref 13.0–17.0)
MCH: 30.2 pg (ref 26.0–34.0)
MCHC: 34.5 g/dL (ref 30.0–36.0)
MCV: 87.3 fL (ref 78.0–100.0)
Platelets: 267 10*3/uL (ref 150–400)
RBC: 4.41 MIL/uL (ref 4.22–5.81)
RDW: 14.6 % (ref 11.5–15.5)
WBC: 7.6 10*3/uL (ref 4.0–10.5)

## 2014-02-23 LAB — TROPONIN I
TROPONIN I: 1.29 ng/mL — AB (ref <0.30)
TROPONIN I: 0.68 ng/mL — AB (ref <0.30)

## 2014-02-23 LAB — CK TOTAL AND CKMB (NOT AT ARMC)
CK, MB: 2.5 ng/mL (ref 0.3–4.0)
Relative Index: 1.2 (ref 0.0–2.5)
Total CK: 203 U/L (ref 7–232)

## 2014-02-23 MED ORDER — ACETAMINOPHEN 325 MG PO TABS
650.0000 mg | ORAL_TABLET | Freq: Four times a day (QID) | ORAL | Status: DC | PRN
  Administered 2014-02-23: 650 mg via ORAL
  Filled 2014-02-23: qty 2

## 2014-02-23 MED ORDER — VANCOMYCIN HCL IN DEXTROSE 1-5 GM/200ML-% IV SOLN
1000.0000 mg | Freq: Three times a day (TID) | INTRAVENOUS | Status: DC
  Administered 2014-02-23 – 2014-02-25 (×7): 1000 mg via INTRAVENOUS
  Filled 2014-02-23 (×9): qty 200

## 2014-02-23 MED ORDER — DEXTROSE 5 % IV SOLN
1.0000 g | Freq: Three times a day (TID) | INTRAVENOUS | Status: DC
  Administered 2014-02-23 – 2014-02-25 (×7): 1 g via INTRAVENOUS
  Filled 2014-02-23 (×9): qty 1

## 2014-02-23 NOTE — Progress Notes (Signed)
PULMONARY  / CRITICAL CARE MEDICINE  Name: Kenneth RussianDonnie Delacruz MRN: [id number] DOB: Apr 12, 1976    ADMISSION DATE:  02/22/2014 CONSULTATION DATE:  02/22/2014  REFERRING MD :  EDP PRIMARY SERVICE: PCCM  CHIEF COMPLAINT:  Acute SOB and cough  BRIEF PATIENT DESCRIPTION: 38 yo male with long history of sarcoidosis who is on 10 mg of prednisone daily, presents with abrupt onset of dry cough that woke him from sleep.  Presentation to the ED with initial evaluation showing SpO2 <85%, significant tachycardia, and a temp of 103.1.  SIGNIFICANT EVENTS / STUDIES:  3/23 CXR >>> Patchy alveolar airspace opacities in the left lung. Cardiomegaly?  LINES / TUBES:  CULTURES: Blood 3/23 >>> GPC in clusters >>>  ANTIBIOTICS: Vanc/Zosyn in ED Ceftriaxone 3/23 >>> 3/24 Azithro 3/23 >>> Vancomycin 3/24 >>> Ceftazidime 3/24 >>>  SUBJECTIVE:  Feels much better than he did yesterday.  Had some SOB early this AM, relieved after a few minutes.  Denies chest pain.  Intermittent coughing, denies hemoptysis.  Denies any recent travel/periods of prolonged immobility, calf/leg pain.  VITAL SIGNS: Temp:  [98.1 F (36.7 C)-102.9 F (39.4 C)] 102.9 F (39.4 C) (03/24 0600) Pulse Rate:  [95-107] 107 (03/24 0600) Resp:  [18-21] 18 (03/24 0600) BP: (138-149)/(81-89) 146/89 mmHg (03/24 0600) SpO2:  [93 %-98 %] 93 % (03/24 0600) HEMODYNAMICS:   VENTILATOR SETTINGS:   INTAKE / OUTPUT: Intake/Output     03/23 0701 - 03/24 0700 03/24 0701 - 03/25 0700   I.V.     IV Piggyback     Total Intake       Net            Urine Occurrence 2 x    Stool Occurrence 1 x      PHYSICAL EXAMINATION: General:  Pleasant male, resting in bed, in NAD. Neuro:  Moves all extremities, no focal deficits, AAOx4 HEENT:  Oral mucosa pink and moist, no conjunctival pallor, no icterus, EOMI Cardiovascular:  Tachycardic, no MRG Lungs:  Right side clear, left with scattered wheezes and rhonchi. Abdomen:  Non tender, non distended, normal  bowel sounds Musculoskeletal:  No tenderness Skin:  No rashes, warm to the touch.  LABS: CBC  Recent Labs Lab 02/22/14 0413 02/22/14 0423 02/23/14 0513  WBC 8.5  --  7.6  HGB 14.3 15.0 13.3  HCT 41.0 44.0 38.5*  PLT 279  --  267   Coag's No results found for this basename: APTT, INR,  in the last 168 hours BMET  Recent Labs Lab 02/22/14 0423 02/22/14 1206 02/23/14 0513  NA 138 142 141  K 3.2* 4.0 3.7  CL 102 105 103  CO2  --  23 25  BUN 20 16 14   CREATININE 1.50* 1.03 1.28  GLUCOSE 113* 124* 100*   Electrolytes  Recent Labs Lab 02/22/14 1206 02/23/14 0513  CALCIUM 8.6 8.2*  MG 1.8  --   PHOS 2.8  --    Sepsis Markers  Recent Labs Lab 02/22/14 0515  LATICACIDVEN 1.0   ABG  Recent Labs Lab 02/22/14 0541  PHART 7.440  PCO2ART 34.3*  PO2ART 64.0*   Liver Enzymes No results found for this basename: AST, ALT, ALKPHOS, BILITOT, ALBUMIN,  in the last 168 hours Cardiac Enzymes  Recent Labs Lab 02/22/14 0944 02/22/14 1638 02/22/14 2218  TROPONINI 0.84* 1.36* 1.42*   Glucose No results found for this basename: GLUCAP,  in the last 168 hours  IMAGING: Dg Chest 2 View  02/22/2014   CLINICAL DATA:  Shortness of breath, history of sarcoidosis.  EXAM: CHEST  2 VIEW  COMPARISON:  DG CHEST 2 VIEW dated 04/25/2011  FINDINGS: Patchy alveolar airspace opacities in the left lung. No pleural effusions.  Cardiac silhouette is upper limits of normal in size, mediastinal silhouette is nonsuspicious. Trachea projects midline and there is no pneumothorax. Soft tissue planes and included osseous structures are nonsuspicious. Multiple EKG lines overlie the patient and may obscure subtle underlying pathology.  IMPRESSION: Patchy alveolar airspace opacities in the left lung concerning for pneumonia. Borderline cardiomegaly. Recommend followup chest radiograph after treatment to verify improvement.   Electronically Signed   By: Awilda Metro   On: 02/22/2014 05:01   Ct  Chest Wo Contrast  02/22/2014   CLINICAL DATA:  Evaluate acute pulmonary exacerbation of sarcoidosis.  EXAM: CT CHEST WITHOUT CONTRAST  TECHNIQUE: Multidetector CT imaging of the chest was performed following the standard protocol without IV contrast.  COMPARISON:  DG CHEST 2 VIEW dated 02/22/2014; CT CHEST W/CM dated 01/26/2006  FINDINGS: There are extensive nodular ground-glass densities throughout the left upper lobe and left lower lobe, predominantly peribronchovascular. Areas of bronchial wall thickening and ground-glass attenuation throughout the left lung. There is relative sparing of the lung apex. Trace left pleural effusion.  Heart is normal size. Aorta is normal caliber. Improvement in mediastinal and hilar adenopathy since prior CT (2007). Left AP window lymph node has a short axis diameter of 11 mm. Right peritracheal node has a short axis diameter of 10 mm on image 24. No axillary or measurable hilar adenopathy on this unenhanced study.  Chest wall soft tissues are unremarkable. Imaging into the upper abdomen shows no acute findings. No acute bony abnormality.  IMPRESSION: Innumerable ground-glass peribronchovascular nodular densities throughout the left lung with scattered ground-glass attenuation and bronchial wall thickening. While this could be related to an acute exacerbation of sarcoidosis are other inflammatory process, the unilateral nature of this raises the possibility of infection, particularly atypical infection.  Trace bilateral effusions.   Electronically Signed   By: Charlett Nose M.D.   On: 02/22/2014 07:44   ASSESSMENT / PLAN:  Sarcoidosis, suspected exacerbation  Supplemental oxygen  Prednisone 40  Albuterol PRN CAP, but should cover Pseudomonas as immunocompromised ( chronic prednisone )  D/c Ceftriaxome  Start Ceftazidime GPC bacteremia, possibly contaminant   Start Vancomycin Elevated troponin. NSTEMI vs demand ischemia.  Cardiomegaly on CXR, unknown LVF.  ASA  TTE    Cardiology consultation Tobacco use disorder  Smoking cessation advised Prophylaxis   GI - not indicated  VTE - Heparin [place]  Rutherford Guys, PA - [facility] [place] [facility] Pgr: [phone]  or [phone]  I have personally obtained history, examined patient, evaluated and interpreted laboratory and imaging results, reviewed medical records, formulated assessment / plan and placed orders.  Lonia Farber, MD Pulmonary and Critical Care Medicine [facility] Pager: [phone]  02/23/2014, 11:00 AM

## 2014-02-23 NOTE — Progress Notes (Signed)
  Echocardiogram 2D Echocardiogram has been performed.  Arvil ChacoFoster, Maron Stanzione 02/23/2014, 2:28 PM

## 2014-02-23 NOTE — Progress Notes (Addendum)
ANTIBIOTIC CONSULT NOTE - INITIAL  Pharmacy Consult for vancomycin Indication: r/o bacteremia  No Known Allergies  Patient Measurements: 97.9 kg  Vital Signs: Temp: 102.9 F (39.4 C) (03/24 0600) BP: 146/89 mmHg (03/24 0600) Pulse Rate: 107 (03/24 0600) Intake/Output from previous day:   Intake/Output from this shift:    Labs:  Recent Labs  02/22/14 0413 02/22/14 0423 02/22/14 1206 02/23/14 0513  WBC 8.5  --   --  7.6  HGB 14.3 15.0  --  13.3  PLT 279  --   --  267  CREATININE  --  1.50* 1.03 1.28   The CrCl is unknown because both a height and weight (above a minimum accepted value) are required for this calculation. No results found for this basename: VANCOTROUGH, VANCOPEAK, VANCORANDOM, GENTTROUGH, GENTPEAK, GENTRANDOM, TOBRATROUGH, TOBRAPEAK, TOBRARND, AMIKACINPEAK, AMIKACINTROU, AMIKACIN,  in the last 72 hours   Microbiology: Recent Results (from the past 720 hour(s))  CULTURE, BLOOD (ROUTINE X 2)     Status: None   Collection Time    02/22/14  5:49 AM      Result Value Ref Range Status   Specimen Description BLOOD HAND RIGHT   Final   Special Requests BOTTLES DRAWN AEROBIC AND ANAEROBIC 5ML   Final   Culture  Setup Time     Final   Value: 02/22/2014 12:45     Performed at [facility]   Culture     Final   Value: GRAM POSITIVE COCCI IN CLUSTERS     Note: Gram Stain Report Called to,Read Back By and Verified With: [facility] Road[facility]BRAH DUVALL 02/23/14 @ 0515 RIDK     Performed at [facility]   Report Status PENDING   Incomplete  CULTURE, BLOOD (ROUTINE X 2)     Status: None   Collection Time    02/22/14  5:52 AM      Result Value Ref Range Status   Specimen Description BLOOD HAND LEFT   Final   Special Requests BOTTLES DRAWN AEROBIC AND ANAEROBIC 5ML   Final   Culture  Setup Time     Final   Value: 02/22/2014 12:45     Performed at [facility]   Culture     Final   Value:        BLOOD CULTURE RECEIVED NO GROWTH TO DATE CULTURE WILL BE HELD  FOR 5 DAYS BEFORE ISSUING A FINAL NEGATIVE REPORT     Performed at [facility]   Report Status PENDING   Incomplete    Medical History: Past Medical History  Diagnosis Date  . Pneumonia   . Pulmonary sarcoidosis   . Weight loss   . HTN (hypertension)     Medications:  Prescriptions prior to admission  Medication Sig Dispense Refill  . predniSONE (DELTASONE) 10 MG tablet Take 10 mg by mouth daily with breakfast.       . triamterene-hydrochlorothiazide (MAXZIDE) 75-50 MG per tablet Take 1 tablet by mouth daily.       Assessment: 38 y/o male cough with sarcoidosis admitted with SOB and cough. He is being treated with azithromycin and ceftriaxone for CAP vs exacerbation of sarcoidosis. Pharmacy consulted to begin vancomycin for r/o bacteremia with 1 of 2 blood cultures with GPC in clusters. It is possible this could be a contaminant. SCr is at the upper limit of normal. WBC are normal and patient is febrile.  Goal of Therapy:  Vancomycin trough level 15-20 mcg/ml  Plan:  - Vancomycin 1000 mg  IV q8h - Monitor renal function, clinical course, and culture data - Vancomycin trough as clinically indicated  [facility], [address].D., BCPS Clinical Pharmacist Pager: [phone] 02/23/2014 9:13 AM   Addendum: Pharmacy consulted to add ceftazidime for HCAP coverage with chronic immunosuppression. Ceftriaxone d/c'ed.  Ceftazidime 1g IV q8h  [facility], [address].D., BCPS Clinical Pharmacist Pager: [phone] 02/23/2014 11:16 AM

## 2014-02-23 NOTE — Consult Note (Signed)
CARDIOLOGY CONSULT NOTE   Patient ID: Kenneth Delacruz MRN: [id number] DOB/AGE: [birth year] 37 y.o.  Admit date: 02/22/2014  Primary Physician   Sandrea Hughs, MD Primary Cardiologist   New  Reason for Consultation   Elevated troponin  Kenneth Delacruz is a 38 y.o. male with no history of CAD.  Pt has pmhx of sarcoidosis and is in the hospital for tx of PNA, currently on vancomycin/ceftazidime.  Pt reports mild SOB, however states he has felt that way during this entire admission.  He denies chest pain, palpitations, dizziness, N/V, leg pain/swelling.  Telemetry shows sinus rhythm with rate 95-110.    Has only been hospitalized for sarcoid once prior to this time. He generally does very well, exercises with weight-lifting and treadmill regularly. Was only sick for a few days prior to admission.   Past Medical History  Diagnosis Date  . Pneumonia   . Pulmonary sarcoidosis 2007  . Weight loss   . HTN (hypertension)      Past Surgical History  Procedure Laterality Date  . Transbronchial biopsy  3.8.07    No Known Allergies  I have reviewed the patient's current medications . aspirin EC  81 mg Oral Daily  . azithromycin  250 mg Oral Daily  . cefTAZidime (FORTAZ)  IV  1 g Intravenous Q8H  . heparin  5,000 Units Subcutaneous 3 times per day  . predniSONE  40 mg Oral Q breakfast  . vancomycin  1,000 mg Intravenous Q8H     acetaminophen, albuterol, chlorpheniramine-HYDROcodone  Prior to Admission medications   Medication Sig Start Date End Date Taking? Authorizing Provider  predniSONE (DELTASONE) 10 MG tablet Take 10 mg by mouth daily with breakfast.  11/16/13  Yes Nyoka Cowden, MD  triamterene-hydrochlorothiazide (MAXZIDE) 75-50 MG per tablet Take 1 tablet by mouth daily. 11/16/13  Yes Nyoka Cowden, MD     History   Social History  . Marital Status: Legally Separated    Spouse Name: N/A    Number of Children: N/A  . Years of Education: N/A   Occupational  History  . Manager at [employer]    Social History Main Topics  . Smoking status: Current Every Day Smoker -- 0.30 packs/day    Types: Cigarettes  . Smokeless tobacco: Never Used  . Alcohol Use: 0.5 oz/week    1 drink(s) per week  . Drug Use: No  . Sexual Activity: Not on file   Other Topics Concern  . Not on file   Social History Narrative   Lives with girlfriend and 2 children.   Family Status  Relation Status Death Age  . Father Alive     No Hx CAD   Family History  Problem Relation Age of Onset  . Asthma Mother   . Stroke Mother      ROS:  Full 14 point review of systems complete and found to be negative unless listed above.  Physical Exam: Blood pressure 112/69, pulse 76, temperature 98.2 F (36.8 C), temperature source Oral, resp. rate 18, SpO2 96.00%.  General: Well developed, well nourished, male in no acute distress Head: Eyes PERRLA, No xanthomas.   Normocephalic and atraumatic, oropharynx without edema or exudate. Dentition: Fair Lungs: CTA bilaterally.  No adventitious sounds noted.  Heart: HRRR S1 S2, no rub/gallop, 2/6 systolic murmur noted best heard in L 5th ICS.   2+ Pulses bilateral Radial and DP.   Neck: No carotid bruits. No lymphadenopathy.  JVD not elevated Abdomen: Bowel  sounds present, abdomen soft and non-tender without masses or hernias noted. Extremities: No clubbing or cyanosis.  No edema noted.  Homan's sign negative with no sign of DVT.  Neuro: Alert and oriented X 3. No focal deficits noted. Psych:  Good affect, responds appropriately Skin: No rashes or lesions noted.  Labs:   Lab Results  Component Value Date   WBC 7.6 02/23/2014   HGB 13.3 02/23/2014   HCT 38.5* 02/23/2014   MCV 87.3 02/23/2014   PLT 267 02/23/2014     Recent Labs Lab 02/23/14 0513  NA 141  K 3.7  CL 103  CO2 25  BUN 14  CREATININE 1.28  CALCIUM 8.2*  GLUCOSE 100*   Magnesium  Date Value Ref Range Status  02/22/2014 1.8  1.5 - 2.5 mg/dL Final    Recent  Labs  02/22/14 0944 02/22/14 1638 02/22/14 2218 02/23/14 1156  TROPONINI 0.84* 1.36* 1.42* 1.29*    Recent Labs  02/22/14 0420  TROPIPOC 0.13*   Echo: pending  ECG:  EKG from 3/23 shows sinus rhythm with LVH noted.  EKG from today pending  Radiology:  Dg Chest 2 View 02/22/2014   CLINICAL DATA:  Shortness of breath, history of sarcoidosis.  EXAM: CHEST  2 VIEW  COMPARISON:  DG CHEST 2 VIEW dated 04/25/2011  FINDINGS: Patchy alveolar airspace opacities in the left lung. No pleural effusions.  Cardiac silhouette is upper limits of normal in size, mediastinal silhouette is nonsuspicious. Trachea projects midline and there is no pneumothorax. Soft tissue planes and included osseous structures are nonsuspicious. Multiple EKG lines overlie the patient and may obscure subtle underlying pathology.  IMPRESSION: Patchy alveolar airspace opacities in the left lung concerning for pneumonia. Borderline cardiomegaly. Recommend followup chest radiograph after treatment to verify improvement.   Electronically Signed   By: Awilda Metroourtnay  Bloomer   On: 02/22/2014 05:01   Ct Chest Wo Contrast 02/22/2014   CLINICAL DATA:  Evaluate acute pulmonary exacerbation of sarcoidosis.  EXAM: CT CHEST WITHOUT CONTRAST  TECHNIQUE: Multidetector CT imaging of the chest was performed following the standard protocol without IV contrast.  COMPARISON:  DG CHEST 2 VIEW dated 02/22/2014; CT CHEST W/CM dated 01/26/2006  FINDINGS: There are extensive nodular ground-glass densities throughout the left upper lobe and left lower lobe, predominantly peribronchovascular. Areas of bronchial wall thickening and ground-glass attenuation throughout the left lung. There is relative sparing of the lung apex. Trace left pleural effusion.  Heart is normal size. Aorta is normal caliber. Improvement in mediastinal and hilar adenopathy since prior CT (2007). Left AP window lymph node has a short axis diameter of 11 mm. Right peritracheal node has a short axis  diameter of 10 mm on image 24. No axillary or measurable hilar adenopathy on this unenhanced study.  Chest wall soft tissues are unremarkable. Imaging into the upper abdomen shows no acute findings. No acute bony abnormality.  IMPRESSION: Innumerable ground-glass peribronchovascular nodular densities throughout the left lung with scattered ground-glass attenuation and bronchial wall thickening. While this could be related to an acute exacerbation of sarcoidosis are other inflammatory process, the unilateral nature of this raises the possibility of infection, particularly atypical infection.  Trace bilateral effusions.   Electronically Signed   By: Charlett NoseKevin  Dover M.D.   On: 02/22/2014 07:44    ASSESSMENT AND PLAN:   The patient was seen today by Dr. Antoine PocheHochrein, the patient evaluated and the data reviewed.  Active Problems:   Hypoxia   NSTEMI, initial episode of care  CAP (community acquired pneumonia)  NSTEMI - Possibly Type II. Due to the fact that pt is asymptomatic at this time, his clinical picture fits that of possible demand ischemia.  We will get a STAT EKG 12 lead, TTE ordered.    Continue to monitor Troponin Q 6 hrs until it peaks.  Pt is on ASA, MD advise on adding low-dose BB (believe BP/HR will tolerate) and/or statin. If EF normal with no WMA on echo, MD advise on further testing.   Signed: Ladona Mow, PA-S 02/23/14 1235   Seen and agree with changes made. Theodore Demark, PA-C 02/23/2014 12:55 PM Beeper [phone]  Co-Sign MD  History and all data above reviewed.  Patient examined.  I agree with the findings as above. The patient presents with pneumonia.  He is found to have an elevated troponin. Also he has had some dynamic EKG changes with change in axis (RAD) and inferior T wave inversion.  He also has a lower voltage than previous.  He has had dyspnea but he has not had chest pain, neck or arm pain.  The patient exam reveals COR:RRR, no rub  ,  Lungs: Decreased breath sounds with  coarse crackles left greater than right lung  ,  Abd: Positive bowel sounds, no rebound no guarding, Ext No edema  .  All available labs, radiology testing, previous records reviewed. Agree with documented assessment and plan. Elevated troponin.  This is likely related to his pneumonia and probable LVH.  However, with EKG changes and the degree of enzyme elevation I will be suggesting a Lexiscan Myoview before discharge.  Echo is being done currently.  I have a very low suspicion of PE.    Rollene Rotunda  1:54 PM  02/23/2014

## 2014-02-24 DIAGNOSIS — I214 Non-ST elevation (NSTEMI) myocardial infarction: Principal | ICD-10-CM

## 2014-02-24 DIAGNOSIS — R0902 Hypoxemia: Secondary | ICD-10-CM

## 2014-02-24 LAB — GLUCOSE, CAPILLARY: GLUCOSE-CAPILLARY: 90 mg/dL (ref 70–99)

## 2014-02-24 LAB — CULTURE, BLOOD (ROUTINE X 2)

## 2014-02-24 LAB — TROPONIN I: Troponin I: 0.44 ng/mL (ref <0.30)

## 2014-02-24 NOTE — Progress Notes (Addendum)
    Subjective:  No chest pain. Overall feeling a little better. Persistent cough noted.  Objective:  Vital Signs in the last 24 hours: Temp:  [97.8 F (36.6 C)-99.1 F (37.3 C)] 99.1 F (37.3 C) (03/25 0618) Pulse Rate:  [71-94] 90 (03/25 0618) Resp:  [18-19] 18 (03/25 0618) BP: (112-159)/(69-97) 143/83 mmHg (03/25 0618) SpO2:  [95 %-97 %] 95 % (03/25 0618)  Intake/Output from previous day:    Physical Exam: Pt is alert and oriented, NAD HEENT: normal Neck: JVP - normal, carotids 2+= without bruits Lungs: Diffuse rhonchi CV: RRR without murmur or gallop Abd: soft, NT, Positive BS, no hepatomegaly Ext: no C/C/E, distal pulses intact and equal Skin: warm/dry no rash   Lab Results:  Recent Labs  02/22/14 0413 02/22/14 0423 02/23/14 0513  WBC 8.5  --  7.6  HGB 14.3 15.0 13.3  PLT 279  --  267    Recent Labs  02/22/14 1206 02/23/14 0513  NA 142 141  K 4.0 3.7  CL 105 103  CO2 23 25  GLUCOSE 124* 100*  BUN 16 14  CREATININE 1.03 1.28    Recent Labs  02/23/14 1944 02/24/14 0133  TROPONINI 0.68* 0.44*    Cardiac Studies: 2D Echo: Study Conclusions  - Left ventricle: The cavity size was normal. There was severe concentric hypertrophy, consistent with hypertrophic cardiomyopathy. Systolic function was vigorous. The estimated ejection fraction was in the range of 65% to 70%. There was no dynamic obstruction. Wall motion was normal; there were no regional wall motion abnormalities. Doppler parameters are consistent with abnormal left ventricular relaxation (grade 1 diastolic dysfunction). - Left atrium: The atrium was mildly dilated. - Pericardium, extracardiac: A small, free-flowing pericardial effusion was identified posterior to the heart. There was no evidence of hemodynamic compromise.    Tele: Personally reviewed, sinus rhythm  Assessment/Plan:  1. Non-ST elevation MI, suspect type II event. 2. Sarcoidosis  exacerbation/community-acquired pneumonia 3. Tobacco abuse 4. Elevated blood pressure  The patient's echocardiogram was reviewed and it is within normal limits. He has eaten breakfast this morning, so [facility]. He has a flat low-level troponin trend which is typical of a type II MI from demand ischemia. I think his overall risk of obstructive CAD/plaque rupture is low. Will check lipids but I am not inclined to start him on a statin drug unless there is evidence of obstructive CAD or significantly elevated lipids. Also would not recommend a beta blocker at this time considering his acute exacerbation of lung disease in the setting of febrile illness. Continue aspirin.  Tonny BollmanMichael Chantal Worthey, M.D. 02/24/2014, 9:23 AM

## 2014-02-24 NOTE — Progress Notes (Signed)
PULMONARY  / CRITICAL CARE MEDICINE  Name: Kenneth Delacruz MRN: [id number] DOB: 1975/12/24    ADMISSION DATE:  02/22/2014 CONSULTATION DATE:  02/22/2014  REFERRING MD :  EDP PRIMARY SERVICE: PCCM  CHIEF COMPLAINT:  Acute SOB and cough  BRIEF PATIENT DESCRIPTION: 38 yo male with long history of sarcoidosis who is on 10 mg of prednisone daily, presents with abrupt onset of dry cough that woke him from sleep.  Presentation to the ED with initial evaluation showing SpO2 <85%, significant tachycardia, and a temp of 103.1.  SIGNIFICANT EVENTS / STUDIES:  3/23 CXR >>> Patchy alveolar airspace opacities in the left lung. Cardiomegaly? 3/23 Venous Doppler LE >>> nad 3/24 TTE >>> Ef 65%, no RWMA, grade 1 DD  LINES / TUBES:  CULTURES: Blood 3/23 >>>  ANTIBIOTICS: Ceftriaxone 3/23 >>> 3/24 Azithro 3/23 >>> Vancomycin 3/24 >>> Ceftazidime 3/24 >>>  SUBJECTIVE:  Feels better. No complaints.  Seen by cardiology.  VITAL SIGNS: Temp:  [97.8 F (36.6 C)-99.1 F (37.3 C)] 99.1 F (37.3 C) (03/25 0618) Pulse Rate:  [71-94] 90 (03/25 0618) Resp:  [18-19] 18 (03/25 0618) BP: (112-159)/(69-97) 143/83 mmHg (03/25 0618) SpO2:  [95 %-97 %] 95 % (03/25 0618) HEMODYNAMICS:   VENTILATOR SETTINGS:   INTAKE / OUTPUT: Intake/Output     03/24 0701 - 03/25 0700 03/25 0701 - 03/26 0700   P.O.  360   Total Intake   360   Net   +360          PHYSICAL EXAMINATION: General:  Resting in no distress Neuro:  Awake, alert HEENT:  PERRL Cardiovascular:  Regular, no murmurs Lungs:  Bilateral air entry, no added sounds Abdomen:  Non tender, non distended, normal bowel sounds Musculoskeletal:  No edema Skin:  intact  LABS: CBC  Recent Labs Lab 02/22/14 0413 02/22/14 0423 02/23/14 0513  WBC 8.5  --  7.6  HGB 14.3 15.0 13.3  HCT 41.0 44.0 38.5*  PLT 279  --  267   Coag's No results found for this basename: APTT, INR,  in the last 168 hours BMET  Recent Labs Lab 02/22/14 0423  02/22/14 1206 02/23/14 0513  NA 138 142 141  K 3.2* 4.0 3.7  CL 102 105 103  CO2  --  23 25  BUN 20 16 14   CREATININE 1.50* 1.03 1.28  GLUCOSE 113* 124* 100*   Electrolytes  Recent Labs Lab 02/22/14 1206 02/23/14 0513  CALCIUM 8.6 8.2*  MG 1.8  --   PHOS 2.8  --    Sepsis Markers  Recent Labs Lab 02/22/14 0515  LATICACIDVEN 1.0   ABG  Recent Labs Lab 02/22/14 0541  PHART 7.440  PCO2ART 34.3*  PO2ART 64.0*   Liver Enzymes No results found for this basename: AST, ALT, ALKPHOS, BILITOT, ALBUMIN,  in the last 168 hours Cardiac Enzymes  Recent Labs Lab 02/23/14 1156 02/23/14 1944 02/24/14 0133  TROPONINI 1.29* 0.68* 0.44*   Glucose  Recent Labs Lab 02/24/14 0750  GLUCAP 90    IMAGING: No results found.  ASSESSMENT / PLAN:  Sarcoidosis, suspected exacerbation  Supplemental oxygen  Prednisone 40, will taper doun to chronic dose over 2 weeks  Albuterol PRN CAP, but should cover Pseudomonas as immunocompromised ( chronic prednisone )  Ceftazidime  Will discharge on Cipro 750 mg BID x 10 days total GPC bacteremia, likely contaminant   D/c Vancomycin as GPC are likely contaminant and cultures are negative up to date  Elevated troponin. NSTEMI vs demand ischemia.  Cardiomegaly on CXR, unknown LVF.  Cardiology input appreciated   ASA  Myoview in am Tobacco use disorder  Smoking cessation Prophylaxis   GI - not indicated  VTE - Heparin [place] Disposition  Likely to d/c 2/26  I have personally obtained history, examined patient, evaluated and interpreted laboratory and imaging results, reviewed medical records, formulated assessment / plan and placed orders.  Lonia FarberZUBELEVITSKIY, Kenneth Silveria, MD Pulmonary and Critical Care Medicine [facility] Pager: [phone]  02/24/2014, 10:42 AM

## 2014-02-25 ENCOUNTER — Inpatient Hospital Stay (HOSPITAL_COMMUNITY): Payer: Medicaid Other

## 2014-02-25 ENCOUNTER — Other Ambulatory Visit: Payer: Self-pay

## 2014-02-25 DIAGNOSIS — I214 Non-ST elevation (NSTEMI) myocardial infarction: Secondary | ICD-10-CM

## 2014-02-25 DIAGNOSIS — J159 Unspecified bacterial pneumonia: Secondary | ICD-10-CM

## 2014-02-25 LAB — LIPID PANEL
CHOL/HDL RATIO: 2.7 ratio
CHOLESTEROL: 155 mg/dL (ref 0–200)
HDL: 58 mg/dL (ref >39)
LDL Cholesterol: 73 mg/dL (ref 0–99)
Triglycerides: 119 mg/dL (ref <150)
VLDL: 24 mg/dL (ref 0–40)

## 2014-02-25 MED ORDER — PREDNISONE 10 MG PO TABS: ORAL_TABLET | ORAL | Status: DC

## 2014-02-25 MED ORDER — TECHNETIUM TC 99M SESTAMIBI GENERIC - CARDIOLITE
30.0000 | Freq: Once | INTRAVENOUS | Status: AC | PRN
  Administered 2014-02-25: 30 via INTRAVENOUS

## 2014-02-25 MED ORDER — REGADENOSON 0.4 MG/5ML IV SOLN
INTRAVENOUS | Status: AC
  Filled 2014-02-25: qty 5

## 2014-02-25 MED ORDER — TECHNETIUM TC 99M SESTAMIBI GENERIC - CARDIOLITE
10.0000 | Freq: Once | INTRAVENOUS | Status: AC | PRN
  Administered 2014-02-25: 10 via INTRAVENOUS

## 2014-02-25 MED ORDER — DILTIAZEM HCL ER BEADS 240 MG PO CP24
240.0000 mg | ORAL_CAPSULE | Freq: Every day | ORAL | Status: DC
  Administered 2014-02-25: 240 mg via ORAL
  Filled 2014-02-25: qty 1

## 2014-02-25 MED ORDER — TRIAMTERENE-HCTZ 75-50 MG PO TABS: 1.0000 | ORAL_TABLET | Freq: Every day | ORAL | Status: DC

## 2014-02-25 MED ORDER — REGADENOSON 0.4 MG/5ML IV SOLN
0.4000 mg | Freq: Once | INTRAVENOUS | Status: AC
  Administered 2014-02-25: 0.4 mg via INTRAVENOUS

## 2014-02-25 MED ORDER — CIPROFLOXACIN HCL 750 MG PO TABS: 750.0000 mg | ORAL_TABLET | Freq: Two times a day (BID) | ORAL | Status: DC

## 2014-02-25 NOTE — Discharge Summary (Signed)
Physician Discharge Summary       Patient ID: Kenneth Delacruz MRN: [id number] DOB/AGE: [birth year] 37 y.o.  Admit date: 02/22/2014 Discharge date: 02/25/2014  Discharge Diagnoses:  Active Problems:   PULMONARY SARCOIDOSIS   HYPERTENSION   Hypoxia   NSTEMI, initial episode of care   CAP (community acquired pneumonia)   Community acquired bacterial pneumonia  Detailed Hospital Course:   38 yo male with long history of sarcoidosis who is on 10 mg of prednisone daily and a current smoker, presents with abrupt onset of dry cough that woke him from sleep. 2/23 early morning awoke from sleep with a dry cough, unable to stop, with significant shortness of breath.  At ED febrile and hypoxic.  He was placed on 2 L [place], with SpO2 >95% following albuterol nebulizer. He became more relaxed, but remained tachypneic and tachycardic. He was also found to have elevated troponin. He was admitted to [facility] for sarcoidosis exacerbation, and elevated troponin. The troponin was thought to be related to demand ischemia.  He was also treated for CAP. His treatments included supplemental O2, nebulized bronchodilators, and IV antibiotics. His respiratory status continued to improve and as such his troponin slowly decreased. His antibiotics were narrowed to cipro. His risk for CAD/plaque rupture where thought to be low, but he was schedule for [facility] 3/26.  Myoview done and showed no evidence of myocardial ischemia or scar.    Discharge Plan by diagnoses  Sarcoidosis, suspected exacerbation  CAP Elevated troponin. NSTEMI vs demand ischemia, most likely demand ischemia. Tobacco use disorder   Discharge Plan:  Prednisone 40, taper down to chronic dose (5mg ) over 2 weeks  Cipro 750 mg BID x 10 days total - Last day 4/1 Continue home BP medication Smoking cessation    Significant Hospital tests/ studies/ interventions and procedures  Consults Cardiology  Discharge Exam: BP 151/70  Pulse 106  Temp(Src) 99 F  (37.2 C) (Oral)  Resp 18  Ht 5' 10.5" (1.791 m)  Wt 99.791 kg (220 lb)  BMI 31.11 kg/m2  SpO2 95%  General: Resting in no distress  Neuro: Awake, alert  HEENT: PERRL  Cardiovascular: Regular, no murmurs  Lungs: Bilateral air entry, occasional rhonchi Abdomen: Non tender, non distended, normal bowel sounds  Musculoskeletal: No edema  Skin: Intact   Labs at discharge Lab Results  Component Value Date   CREATININE 1.28 02/23/2014   BUN 14 02/23/2014   NA 141 02/23/2014   K 3.7 02/23/2014   CL 103 02/23/2014   CO2 25 02/23/2014   Lab Results  Component Value Date   WBC 7.6 02/23/2014   HGB 13.3 02/23/2014   HCT 38.5* 02/23/2014   MCV 87.3 02/23/2014   PLT 267 02/23/2014   No results found for this basename: ALT,  AST,  GGT,  ALKPHOS,  BILITOT   No results found for this basename: INR,  PROTIME    Current radiology studies No results found.  Disposition:  Final discharge disposition not confirmed     Medication List    ASK your doctor about these medications       predniSONE 10 MG tablet  Commonly known as:  DELTASONE  Take 10 mg by mouth daily with breakfast.     triamterene-hydrochlorothiazide 75-50 MG per tablet  Commonly known as:  MAXZIDE  Take 1 tablet by mouth daily.        Discharged Condition: good  Rutherford Guys, PA - [facility] Pgr: [phone]  or [phone]  Lonia FarberZUBELEVITSKIY, Drewey Begue, MD Pulmonary and Critical Care Medicine [facility] Pager: [phone]

## 2014-02-25 NOTE — Progress Notes (Signed)
Lexiscan CL performed 

## 2014-02-25 NOTE — Progress Notes (Signed)
    Subjective:  No chest pain or pressure. Still with cough and shortness of breath. No other complaints.  Objective:  Vital Signs in the last 24 hours: Temp:  [98.1 F (36.7 C)-99.1 F (37.3 C)] 98.8 F (37.1 C) (03/25 2143) Pulse Rate:  [86-96] 86 (03/25 2143) Resp:  [18] 18 (03/25 2143) BP: (143-158)/(79-90) 158/79 mmHg (03/25 2143) SpO2:  [95 %-97 %] 97 % (03/25 2143)  Intake/Output from previous day: 03/25 0701 - 03/26 0700 In: 1200 [P.O.:1200] Out: -   Physical Exam: Pt is alert and oriented, NAD HEENT: normal Neck: JVP - normal, carotids 2+= without bruits Lungs: Coarse rhonchi bilaterally CV: RRR without murmur or gallop Abd: soft, NT, Positive BS, no hepatomegaly Ext: no C/C/E, distal pulses intact and equal Skin: warm/dry no rash   Lab Results:  Recent Labs  02/23/14 0513  WBC 7.6  HGB 13.3  PLT 267    Recent Labs  02/22/14 1206 02/23/14 0513  NA 142 141  K 4.0 3.7  CL 105 103  CO2 23 25  GLUCOSE 124* 100*  BUN 16 14  CREATININE 1.03 1.28    Recent Labs  02/23/14 1944 02/24/14 0133  TROPONINI 0.68* 0.44*    Cardiac Studies: 2-D echocardiogram: Study Conclusions  - Left ventricle: The cavity size was normal. There was severe concentric hypertrophy, consistent with hypertrophic cardiomyopathy. Systolic function was vigorous. The estimated ejection fraction was in the range of 65% to 70%. There was no dynamic obstruction. Wall motion was normal; there were no regional wall motion abnormalities. Doppler parameters are consistent with abnormal left ventricular relaxation (grade 1 diastolic dysfunction). - Left atrium: The atrium was mildly dilated. - Pericardium, extracardiac: A small, free-flowing pericardial effusion was identified posterior to the heart. There was no evidence of hemodynamic compromise.  Tele: Sinus rhythm, personally reviewed.  Assessment/Plan:  1. Non-STEMI, suspect type II 2. Sarcoidosis  exacerbation/community-acquired pneumonia 3. Essential hypertension  Patient for Myoview scan this morning. If this is low risk, he could be discharged home from a cardiac perspective. His blood pressure is consistently elevated. He thinks he was taking Tiazac at home, but ran out a few weeks ago. Will resume. I would not use a beta blocker unless he is found to have obstructive CAD or a moderate/high-risk Myoview. Awaiting lipid panel for consideration of a statin drug at discharge.  Tonny BollmanMichael Zakira Ressel, M.D. 02/25/2014, 6:08 AM

## 2014-02-28 LAB — CULTURE, BLOOD (ROUTINE X 2): CULTURE: NO GROWTH

## 2014-03-04 ENCOUNTER — Encounter: Payer: Self-pay | Admitting: Internal Medicine

## 2014-03-04 ENCOUNTER — Inpatient Hospital Stay: Payer: Self-pay | Admitting: Internal Medicine

## 2014-03-04 ENCOUNTER — Ambulatory Visit (INDEPENDENT_AMBULATORY_CARE_PROVIDER_SITE_OTHER): Payer: Self-pay | Admitting: Internal Medicine

## 2014-03-04 VITALS — BP 126/90 | HR 84 | Temp 98.4°F | Ht 71.0 in | Wt 212.4 lb

## 2014-03-04 DIAGNOSIS — D869 Sarcoidosis, unspecified: Secondary | ICD-10-CM

## 2014-03-04 MED ORDER — PREDNISONE 10 MG PO TABS: ORAL_TABLET | ORAL | Status: DC

## 2014-03-04 NOTE — Patient Instructions (Addendum)
Ceiling for prednisone is  20 mg daily and the floor is 5 mg every other day.

## 2014-03-04 NOTE — Progress Notes (Signed)
Subjective:     Patient ID: Kenneth RussianDonnie Sturgeon, male   DOB: October 21, 1976   MRN: [id number]     Brief patient profile:   37  yobm quit smoking smoking 03/2009 dx with sarcoid 2006 with tissue dx 01/2006 presenting with  cough wt loss sob, rx prednisone and eventually tapered it off in 2007    History of Present Illness  June 08, 2009 [facility] with deep breath and sob with activity, feels just like his sarcoid. rec prednisone 10 2 each am until better then taper to one half   July 21, 2009 ov 100% except doe x exertion so decreased to 10 mg one half daily.   October 18, 2009 cc his breathing has overall been okay. Pt taking 5 mg of prednisone qd. No complaints of aches/ pains/ rash/sob /coough/ocular co's. rec try pred 5 mg every other day then return for f/u, did not return as rec.   May 24, 2010 ov Followup sarcoid. finished prednisone rx around 01/2010 breathing did fine off it completely. > maintain on 5 mg qod   04/25/2011 ov/Wert cc doe heavy ex only. No ocular or articular symptoms, rash or cough off prednisone x 2 weeks when ran out without flare of any of the previous symptoms. rec The lowest dose of prednisone is always the best dose as long as it works Try off prednisone completely and if the same problem comes back start back on prednisone 10 mg per day until back to normal, then one half x two weeks and then one half on odd days until seen every 3 months   12/25/2012 f/u ov/Wert prednisone down to 5 mg qod then increased to 10 mg per day x 6 weeks due to tightness/ cough ? Also maybe related to resuming smoking with loss of wife.  No rash or arthralgias.  rec Prednisone ceiling is 10 mg day and floor is 5 mg every other day Please schedule a follow up office visit in 6 weeks, call sooner if needed PFT's and CxR > not done    11/16/2013 f/u ov/Wert resumed smoking e:  Sarcoid/ hbp Chief Complaint  Patient presents with  . Follow-up    Pt c/o rt side CP-  when takes in a deep breath or turns a certain way x 2 wks. He also reports slight increase in DOE for the past couple wks.   Floor of 5 mg qod  Most most the time works  Cp identical to prev flares bilateral R > L flanks, positional and worse worse with deep breath  rec No change prednisone 10 mg one half every other day and ceiling 10 day     Admit date: 02/22/2014  Discharge date: 02/25/2014  Discharge Diagnoses:  Active Problems:  PULMONARY SARCOIDOSIS  HYPERTENSION  Hypoxia  NSTEMI, initial episode of care  CAP (community acquired pneumonia)  Community acquired bacterial pneumonia  Detailed Hospital Course:  38 yo male with long history of sarcoidosis who is on 10 mg of prednisone daily and a current smoker, presents with abrupt onset of dry cough that woke him from sleep. 2/23 early morning awoke from sleep with a dry cough, unable to stop, with significant shortness of breath. At ED febrile and hypoxic. He was placed on 2 L [place], with SpO2 >95% following albuterol nebulizer. He became more relaxed, but remained tachypneic and tachycardic. He was also found to have elevated troponin. [facility], and elevated  troponin. The troponin was thought to be related to demand ischemia. He was also treated for CAP. His treatments included supplemental O2, nebulized bronchodilators, and IV antibiotics. His respiratory status continued to improve and as such his troponin slowly decreased. His antibiotics were narrowed to cipro. His risk for CAD/plaque rupture where thought to be low, but he was schedule for [facility] 3/26. Myoview done and showed no evidence of myocardial ischemia or scar.  Discharge Plan by diagnoses  Sarcoidosis, suspected exacerbation  CAP  Elevated troponin. NSTEMI vs demand ischemia, most likely demand ischemia.  Tobacco use disorder  Discharge Plan:  Prednisone 40, taper down to chronic dose (5mg ) over 2 weeks  Cipro 750 mg BID x 10 days  total - Last day 4/1  Continue home BP medication  Smoking cessation     03/04/2014 f/u ov/Wert re:  Chief Complaint  Patient presents with  . HFU    Pt reports that his breathing has improved since hospital d/c, but not back to his normal baseline yet. He states that when he does too much talking or gets SOB he has non prod cough.    prednisone tapered to 30 mg per day   No obvious day to day or daytime variabilty or assoc  chest tightness, subjective wheeze overt sinus or hb symptoms. No unusual exp hx or h/o childhood pna/ asthma or knowledge of premature birth.  Sleeping ok without nocturnal  or early am exacerbation  of respiratory  c/o's or need for noct saba. Also denies any obvious fluctuation of symptoms with weather or environmental changes or other aggravating or alleviating factors except as outlined above   Current Medications, Allergies, Complete Past Medical History, Past Surgical History, Family History, and Social History were reviewed in [employer] record.  ROS  The following are not active complaints unless bolded sore throat, dysphagia, dental problems, itching, sneezing,  nasal congestion or excess/ purulent secretions, ear ache,   fever, chills, sweats, unintended wt loss,   exertional cp, hemoptysis,  orthopnea pnd or leg swelling, presyncope, palpitations, heartburn, abdominal pain, anorexia, nausea, vomiting, diarrhea  or change in bowel or urinary habits, change in stools or urine, dysuria,hematuria,  rash, arthralgias, visual complaints, headache, numbness weakness or ataxia or problems with walking or coordination,  change in mood/affect or memory.          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 Nl external ear canals without cough reflex   NECK :  without JVD/Nodes/TM/ nl carotid upstrokes bilaterally   LUNGS: no acc muscle use, clear to A and P bilaterally without cough on insp or exp maneuvers   CV:  RRR  no s3 or murmur or increase in P2, no edema   ABD:  soft and nontender with nl excursion in the supine position. No bruits or organomegaly, bowel sounds nl  MS:  warm without deformities, calf tenderness, cyanosis or clubbing      02/22/14 CT chest Innumerable ground-glass peribronchovascular nodular densities  throughout the left lung with scattered ground-glass attenuation and  bronchial wall thickening. While this could be related to an acute  exacerbation of sarcoidosis are other inflammatory process, the  unilateral nature of this raises the possibility of infection,  particularly atypical infection.  Trace bilateral effusions      Plan:

## 2014-03-06 NOTE — Assessment & Plan Note (Signed)
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 While this could be related to an acute  exacerbation of sarcoidosis are other inflammatory process, the  unilateral nature of this raises the possibility of infection,  particularly atypical infection.  Trace bilateral effusions - New ceiling prednisone as of 03/04/14 = 20 mg day with floor of 5 mg qod  His acute symptoms probably were CAP related and he's nearly back to his baseline > See instructions for specific recommendations which were reviewed directly with the patient who was given a copy with highlighter outlining the key components.

## 2014-03-10 ENCOUNTER — Telehealth: Payer: Self-pay | Admitting: Internal Medicine

## 2014-03-10 MED ORDER — PREDNISONE 10 MG PO TABS: ORAL_TABLET | ORAL | Status: DC

## 2014-03-10 NOTE — Telephone Encounter (Signed)
Rx for pred refilled  Spoke with the pt and notified that this was done  He verbalized understanding and nothing further needed

## 2014-11-09 IMAGING — CT CT CHEST W/O CM
2 of 3 series · 15 of 36 positions shown, 18 images · non-contrast
Comparison: DG CHEST 2 VIEW dated 02/22/2014; CT CHEST W/CM dated
01/26/2006

CLINICAL DATA: Evaluate acute pulmonary exacerbation of
sarcoidosis.

EXAM:
CT CHEST WITHOUT CONTRAST
TECHNIQUE: Multidetector CT imaging of the chest was performed following the
standard protocol without IV contrast.

[Series 2: thorax 5.0 i31f 1 · axial · 0.74mm/px · z∈[+1184,+1459]mm · 12 of 65 slices shown, 15 images]
[im 5/65  mediastinal]
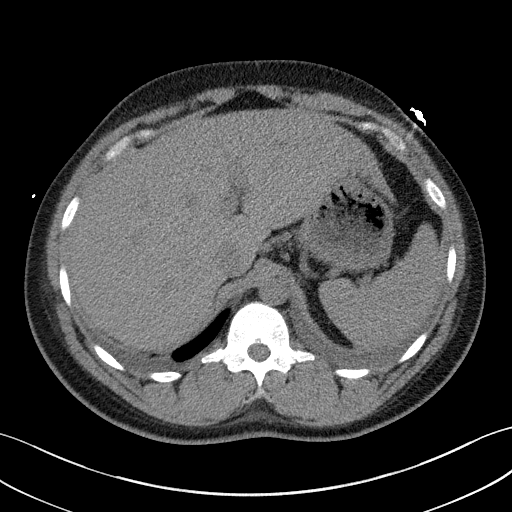
[im 5/65  lung]
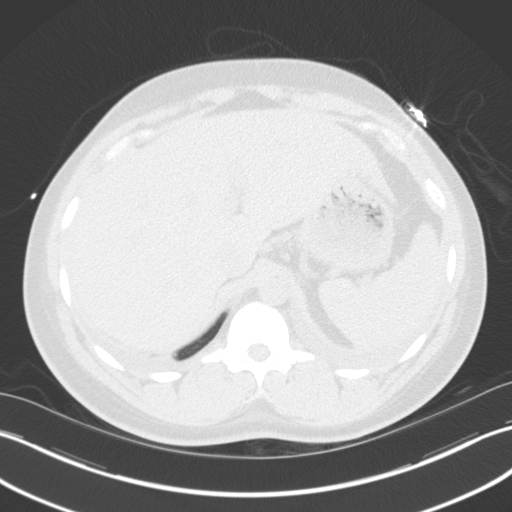
[im 10/65  lung]
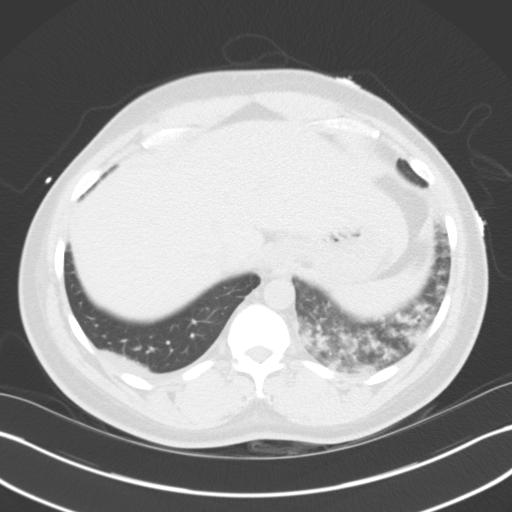
[im 15/65  lung]
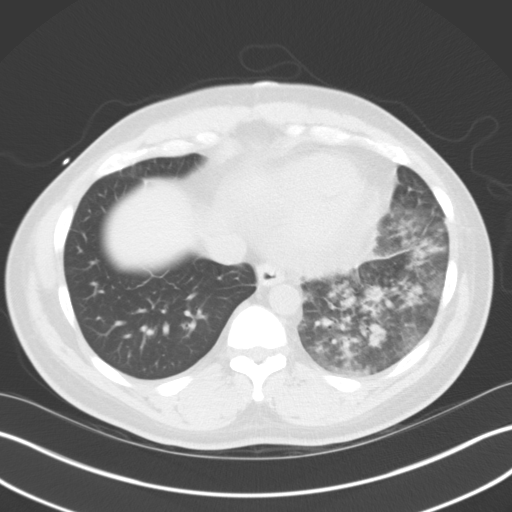
[im 19/65  lung]
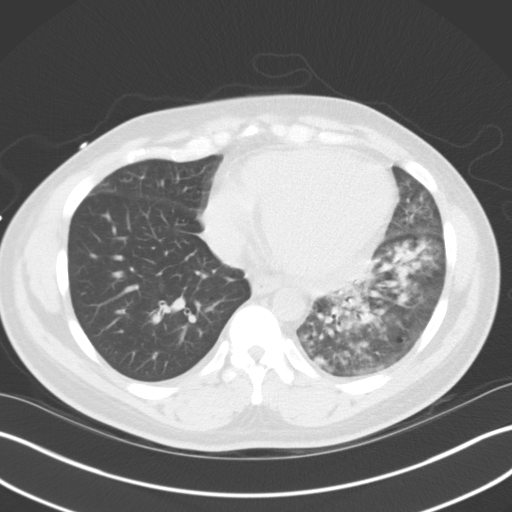
[im 24/65  mediastinal]
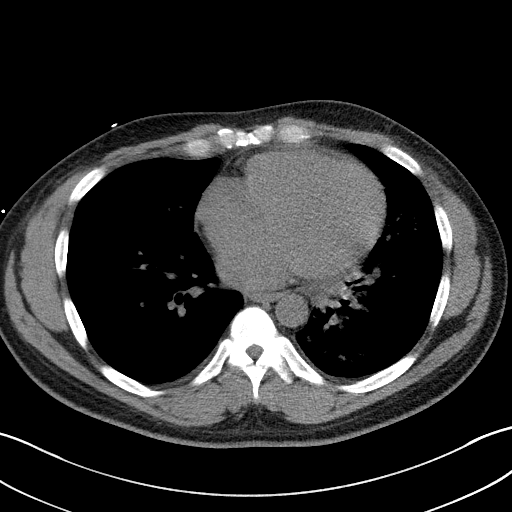
[im 24/65  lung]
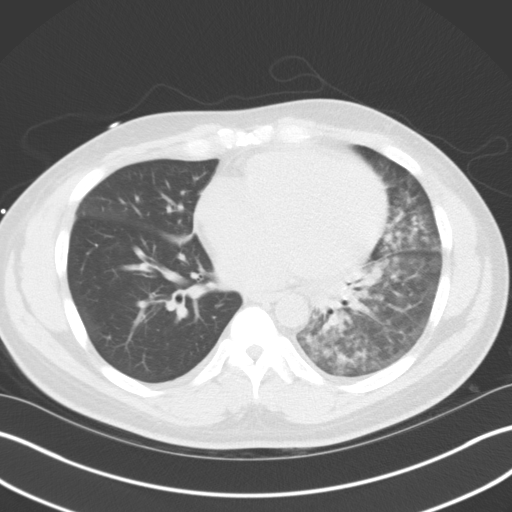
[im 29/65  lung]
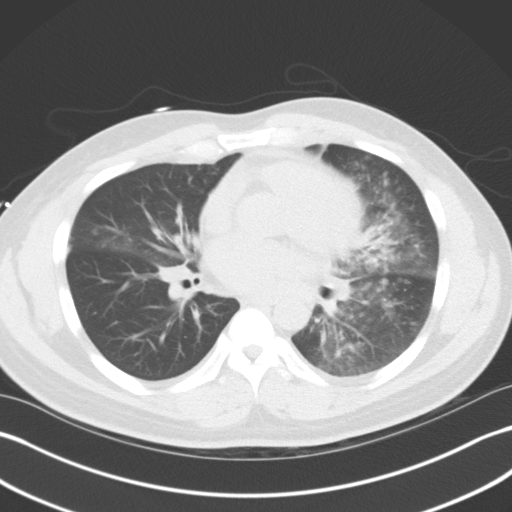
[im 36/65  lung]
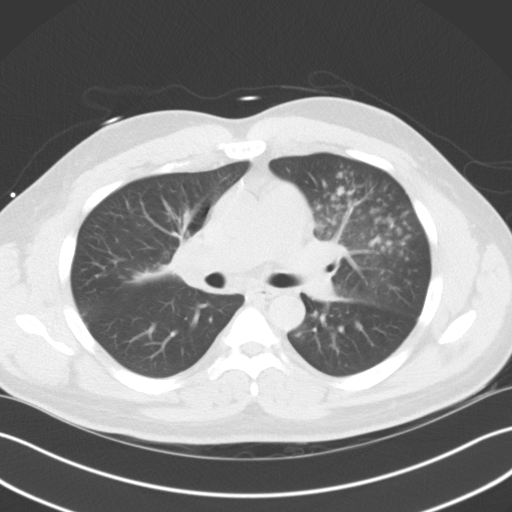
[im 41/65  lung]
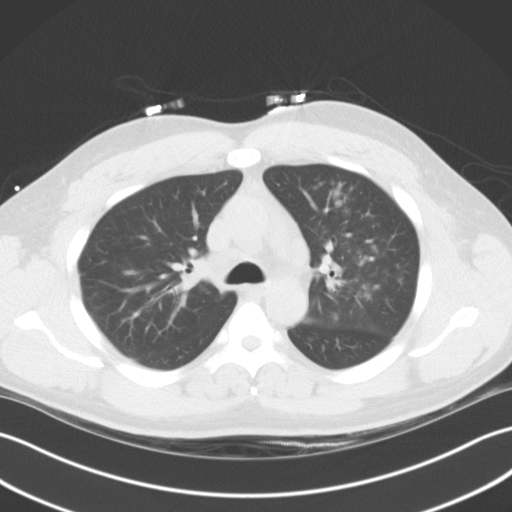
[im 46/65  mediastinal]
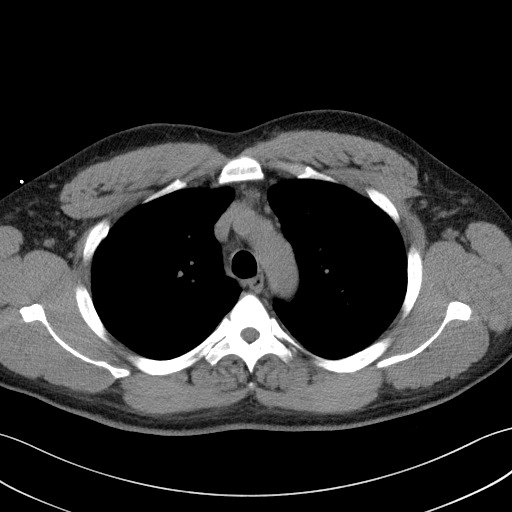
[im 46/65  lung]
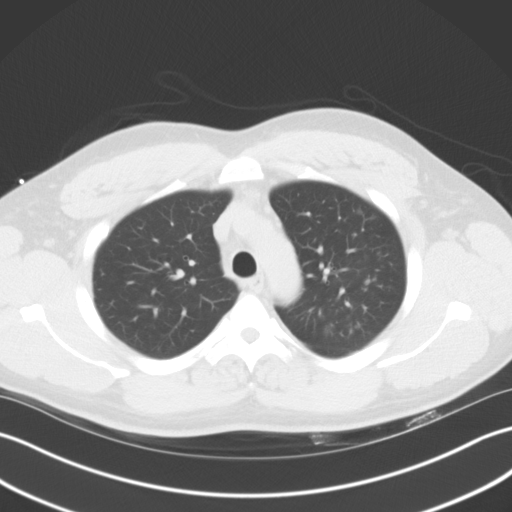
[im 50/65  lung]
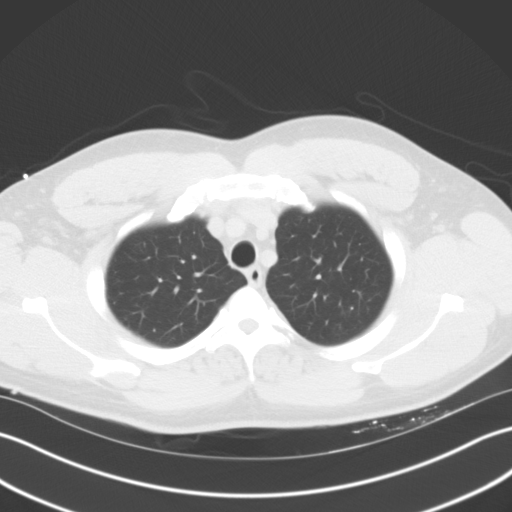
[im 55/65  lung]
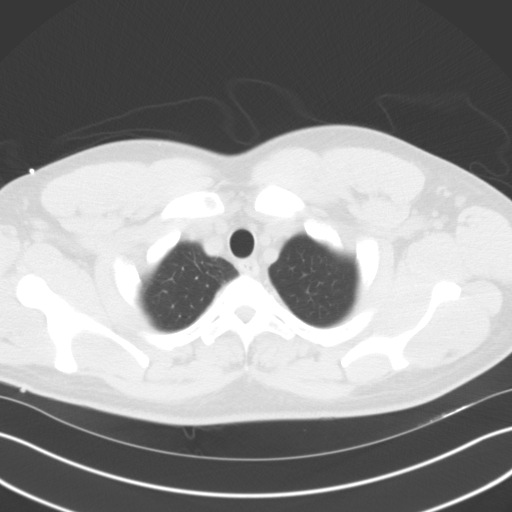
[im 60/65  lung]
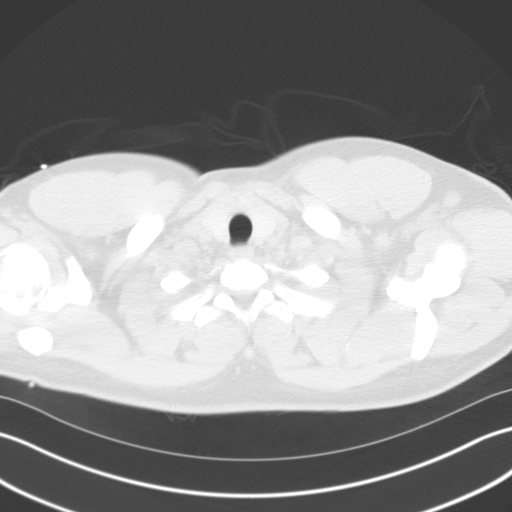

[Series 5: coronal · coronal · 0.81mm/px · 3 of 101 slices shown]
[im 21/101  lung]
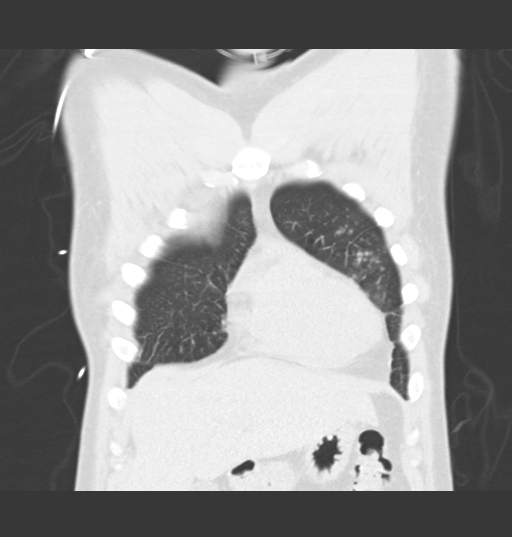
[im 41/101  lung]
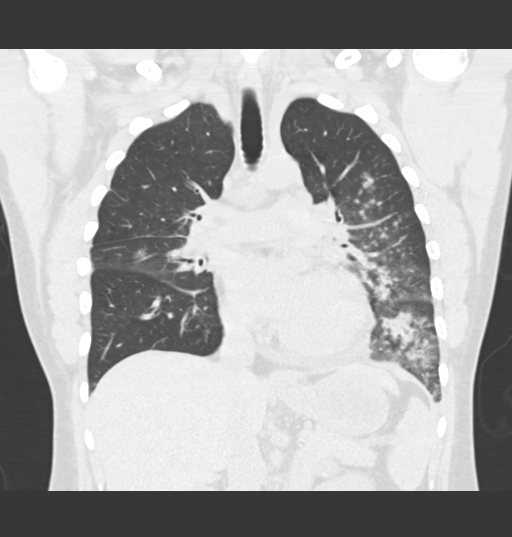
[im 61/101  lung]
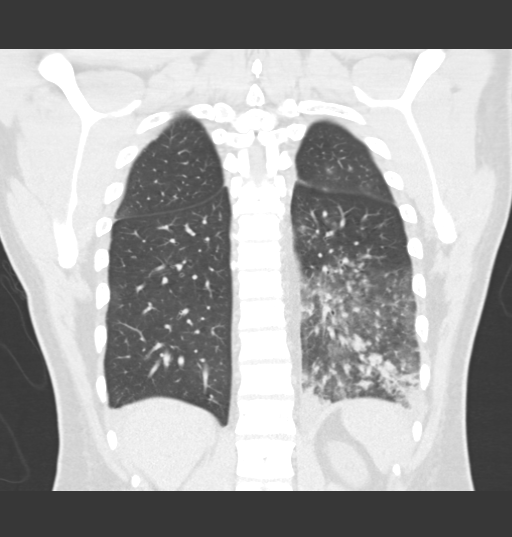

[15 of 36 positions shown; findings below may reference images not displayed]

FINDINGS: There are extensive nodular ground-glass densities throughout the
left upper lobe and left lower lobe, predominantly
peribronchovascular. Areas of bronchial wall thickening and
ground-glass attenuation throughout the left lung. There is relative
sparing of the lung apex. Trace left pleural effusion.

Heart is normal size. Aorta is normal caliber. Improvement in
mediastinal and hilar adenopathy since prior CT (5770). Left AP
window lymph node has a short axis diameter of 11 mm. Right
peritracheal node has a short axis diameter of 10 mm on image 24. No
axillary or measurable hilar adenopathy on this unenhanced study.

Chest wall soft tissues are unremarkable. Imaging into the upper
abdomen shows no acute findings. No acute bony abnormality.
IMPRESSION: Innumerable ground-glass peribronchovascular nodular densities
throughout the left lung with scattered ground-glass attenuation and
bronchial wall thickening. While this could be related to an acute
exacerbation of sarcoidosis are other inflammatory process, the
unilateral nature of this raises the possibility of infection,
particularly atypical infection.

Trace bilateral effusions.

## 2015-03-16 ENCOUNTER — Telehealth: Payer: Self-pay | Admitting: Internal Medicine

## 2015-03-16 MED ORDER — PREDNISONE 10 MG PO TABS: ORAL_TABLET | ORAL | Status: DC

## 2015-03-16 MED ORDER — TRIAMTERENE-HCTZ 75-50 MG PO TABS: 1.0000 | ORAL_TABLET | Freq: Every day | ORAL | Status: DC

## 2015-03-16 NOTE — Telephone Encounter (Signed)
Patient given 30 days supply refill.  Patient notified via voicemail that Rx has been sent and that he needs to contact our office to schedule an appointment.  Patient has not been seen since 03/2014, needs OV.   Nothing further needed.

## 2015-03-31 ENCOUNTER — Encounter: Payer: Self-pay | Admitting: Internal Medicine

## 2015-03-31 ENCOUNTER — Encounter (INDEPENDENT_AMBULATORY_CARE_PROVIDER_SITE_OTHER): Payer: Self-pay

## 2015-03-31 ENCOUNTER — Ambulatory Visit (INDEPENDENT_AMBULATORY_CARE_PROVIDER_SITE_OTHER): Payer: Self-pay | Admitting: Internal Medicine

## 2015-03-31 VITALS — BP 144/94 | HR 86 | Ht 71.0 in | Wt 222.0 lb

## 2015-03-31 DIAGNOSIS — D869 Sarcoidosis, unspecified: Secondary | ICD-10-CM

## 2015-03-31 DIAGNOSIS — Z720 Tobacco use: Secondary | ICD-10-CM

## 2015-03-31 DIAGNOSIS — F17200 Nicotine dependence, unspecified, uncomplicated: Secondary | ICD-10-CM

## 2015-03-31 DIAGNOSIS — I10 Essential (primary) hypertension: Secondary | ICD-10-CM

## 2015-03-31 MED ORDER — PREDNISONE 10 MG PO TABS: ORAL_TABLET | ORAL | Status: DC

## 2015-03-31 MED ORDER — TRIAMTERENE-HCTZ 75-50 MG PO TABS: 1.0000 | ORAL_TABLET | Freq: Every day | ORAL | Status: DC

## 2015-03-31 NOTE — Assessment & Plan Note (Signed)
Noted, advised to avoid salt, continue diuretic and f/u on his own as has not primary doctor in system

## 2015-03-31 NOTE — Progress Notes (Signed)
Subjective:     Patient ID: Kenneth Delacruz, male   DOB: October 21, 1976   MRN: [id number]     Brief patient profile:   37  yobm quit smoking smoking 03/2009 dx with sarcoid 2006 with tissue dx 01/2006 presenting with  cough wt loss sob, rx prednisone and eventually tapered it off in 2007    History of Present Illness  June 08, 2009 [facility] cough with deep breath and sob with activity, feels just like his sarcoid. rec prednisone 10 2 each am until better then taper to one half   July 21, 2009 ov 100% except doe x exertion so decreased to 10 mg one half daily.   October 18, 2009 cc his breathing has overall been okay. Pt taking 5 mg of prednisone qd. No complaints of aches/ pains/ rash/sob /coough/ocular co's. rec try pred 5 mg every other day then return for f/u, did not return as rec.   May 24, 2010 ov Followup sarcoid. finished prednisone rx around 01/2010 breathing did fine off it completely. > maintain on 5 mg qod   04/25/2011 ov/Kenneth Delacruz cc doe heavy ex only. No ocular or articular symptoms, rash or cough off prednisone x 2 weeks when ran out without flare of any of the previous symptoms. rec The lowest dose of prednisone is always the best dose as long as it works Try off prednisone completely and if the same problem comes back start back on prednisone 10 mg per day until back to normal, then one half x two weeks and then one half on odd days until seen every 3 months   12/25/2012 f/u ov/Kenneth Delacruz prednisone down to 5 mg qod then increased to 10 mg per day x 6 weeks due to tightness/ cough ? Also maybe related to resuming smoking with loss of wife.  No rash or arthralgias.  rec Prednisone ceiling is 10 mg day and floor is 5 mg every other day Please schedule a follow up office visit in 6 weeks, call sooner if needed PFT's and CxR > not done    11/16/2013 f/u ov/Kenneth Delacruz resumed smoking e:  Sarcoid/ hbp Chief Complaint  Patient presents with  . Follow-up    Pt c/o rt side CP-  when takes in a deep breath or turns a certain way x 2 wks. He also reports slight increase in DOE for the past couple wks.   Floor of 5 mg qod  Most most the time works  Cp identical to prev flares bilateral R > L flanks, positional and worse worse with deep breath  rec No change prednisone 10 mg one half every other day and ceiling 10 day     Admit date: 02/22/2014  Discharge date: 02/25/2014  Discharge Diagnoses:  Active Problems:  PULMONARY SARCOIDOSIS  HYPERTENSION  Hypoxia  NSTEMI, initial episode of care  CAP (community acquired pneumonia)  Community acquired bacterial pneumonia  Detailed Hospital Course:  39 yo male with long history of sarcoidosis who is on 10 mg of prednisone daily and a current smoker, presents with abrupt onset of dry cough that woke him from sleep. 2/23 early morning awoke from sleep with a dry cough, unable to stop, with significant shortness of breath. At ED febrile and hypoxic. He was placed on 2 L [place], with SpO2 >95% following albuterol nebulizer. He became more relaxed, but remained tachypneic and tachycardic. He was also found to have elevated troponin. [facility], and elevated  troponin. The troponin was thought to be related to demand ischemia. He was also treated for CAP. His treatments included supplemental O2, nebulized bronchodilators, and IV antibiotics. His respiratory status continued to improve and as such his troponin slowly decreased. His antibiotics were narrowed to cipro. His risk for CAD/plaque rupture where thought to be low, but [facility] Myoview done and showed no evidence of myocardial ischemia or scar.  Discharge Plan by diagnoses  Sarcoidosis, suspected exacerbation  CAP  Elevated troponin. NSTEMI vs demand ischemia, most likely demand ischemia.  Tobacco use disorder  Discharge Plan:  Prednisone 40, taper down to chronic dose (5mg ) over 2 weeks  Cipro 750 mg BID x 10 days  total - Last day 4/1  Continue home BP medication  Smoking cessation     03/04/2014 f/u ov/Kenneth Delacruz re:  sarcoid Chief Complaint  Patient presents with  . HFU    Pt reports that his breathing has improved since hospital d/c, but not back to his normal baseline yet. He states that when he does too much talking or gets SOB he has non prod cough.    prednisone tapered to 30 mg per day  rec Ceiling for prednisone is  20 mg daily and the floor is 5 mg every other day.   03/31/2015 f/u ov/Kenneth Delacruz re:  Chief Complaint  Patient presents with  . Follow-up    Pt c/o of SOB with activity, dry cough, slight wheezing at bedtime. Occasional chest tightness/burning sensation.   no flare x sev months then indolent onset progressive symptoms until restarted prednisone 10 mg  One week prior to OV  And tapered rapidly to 10 mg one half qod did not understand ceiling concept and still having doe/ dry cough and noct wheeze though not as bad as when off pred completely, no new symptoms vs original dx  No obvious day to day or daytime variabilty or assoc   overt sinus or hb symptoms. No unusual exp hx or h/o childhood pna/ asthma or knowledge of premature birth.  Sleeping ok without nocturnal  or early am exacerbation  of respiratory  c/o's or need for noct saba. Also denies any obvious fluctuation of symptoms with weather or environmental changes or other aggravating or alleviating factors except as outlined above   Current Medications, Allergies, Complete Past Medical History, Past Surgical History, Family History, and Social History were reviewed in [facility] electronic [facility] record.  ROS  The following are not active complaints unless bolded sore throat, dysphagia, dental problems, itching, sneezing,  nasal congestion or excess/ purulent secretions, ear ache,   fever, chills, sweats, unintended wt loss,   exertional cp, hemoptysis,  orthopnea pnd or leg swelling, presyncope, palpitations, heartburn,  abdominal pain, anorexia, nausea, vomiting, diarrhea  or change in bowel or urinary habits, change in stools or urine, dysuria,hematuria,  rash, arthralgias, visual complaints, headache, numbness weakness or ataxia or problems with walking or coordination,  change in mood/affect or memory.          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 Nl external ear canals without cough reflex   NECK :  without JVD/Nodes/TM/ nl carotid upstrokes bilaterally   LUNGS: no acc muscle use, clear to A and P bilaterally without cough on insp or exp maneuvers   CV:  RRR  no s3 or murmur or increase in P2, no edema   ABD:  soft and nontender with nl excursion in the supine position. No bruits or organomegaly, bowel sounds nl  MS:  warm without deformities, calf tenderness, cyanosis or clubbing     CXR PA and Lateral:   03/31/2015 :     I personally reviewed images and agree with radiology impression as follows:    pt declined as no insurance     Plan:

## 2015-03-31 NOTE — Patient Instructions (Addendum)
Prednisone 10 mg 2 each am until better,  Then one daily x one week, then one half daily x one week then one half every other day   Return if not 100% better or 6 months

## 2015-03-31 NOTE — Assessment & Plan Note (Signed)
Only smoking "4 a week" which is hardly a habit but strongly advised avoidance of all tobacco products to avoid rehabituation

## 2015-03-31 NOTE — Assessment & Plan Note (Signed)
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 While this could be related to an acute  exacerbation of sarcoidosis are other inflammatory process, the  unilateral nature of this raises the possibility of infection,  particularly atypical infection.  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  For now therefore I recommend the patient maintain  Ceiling of 20 and  A floor of 5 mg qod  Each maintenance medication was reviewed in detail including most importantly the difference between maintenance and as needed and under what circumstances the prns are to be used.  Please see instructions for details which were reviewed in writing and the patient given a copy.

## 2015-06-27 ENCOUNTER — Telehealth: Payer: Self-pay | Admitting: Internal Medicine

## 2015-06-27 NOTE — Telephone Encounter (Signed)
Patient called to get refill on BP med.   Checked chart and patient had 11 refills attached to RX, patient says that his bottle says that he has no refills. Called [facility] to confirm that refills are available, pharmacist says that his prescription has been waiting to be picked up for a week and patient has not picked it up yet.  He still has plenty of refills available. Called and advised patient that refills are available and RX is ready to be picked up.  Nothing further needed.

## 2015-09-27 ENCOUNTER — Encounter: Payer: Self-pay | Admitting: Internal Medicine

## 2015-09-27 ENCOUNTER — Ambulatory Visit (INDEPENDENT_AMBULATORY_CARE_PROVIDER_SITE_OTHER): Payer: Self-pay | Admitting: Internal Medicine

## 2015-09-27 ENCOUNTER — Other Ambulatory Visit (INDEPENDENT_AMBULATORY_CARE_PROVIDER_SITE_OTHER): Payer: Self-pay

## 2015-09-27 VITALS — BP 146/96 | HR 90 | Ht 70.0 in | Wt 223.6 lb

## 2015-09-27 DIAGNOSIS — F17200 Nicotine dependence, unspecified, uncomplicated: Secondary | ICD-10-CM

## 2015-09-27 DIAGNOSIS — Z720 Tobacco use: Secondary | ICD-10-CM

## 2015-09-27 DIAGNOSIS — D869 Sarcoidosis, unspecified: Secondary | ICD-10-CM

## 2015-09-27 DIAGNOSIS — I10 Essential (primary) hypertension: Secondary | ICD-10-CM

## 2015-09-27 LAB — BASIC METABOLIC PANEL
BUN: 20 mg/dL (ref 6–23)
CALCIUM: 9.8 mg/dL (ref 8.4–10.5)
CO2: 31 mEq/L (ref 19–32)
Chloride: 101 mEq/L (ref 96–112)
Creatinine, Ser: 1.22 mg/dL (ref 0.40–1.50)
GFR: 85 mL/min (ref >60.00)
Glucose, Bld: 96 mg/dL (ref 70–99)
POTASSIUM: 4.4 meq/L (ref 3.5–5.1)
Sodium: 140 mEq/L (ref 135–145)

## 2015-09-27 MED ORDER — PREDNISONE 2.5 MG PO TABS: ORAL_TABLET | ORAL | Status: AC

## 2015-09-27 NOTE — Progress Notes (Signed)
Subjective:     Patient ID: Kenneth Delacruz, male   DOB: October 21, 1976   MRN: [id number]     Brief patient profile:   37  yobm quit smoking smoking 03/2009 dx with sarcoid 2006 with tissue dx 01/2006 presenting with  cough wt loss sob, rx prednisone and eventually tapered it off in 2007    History of Present Illness  June 08, 2009 [facility] cough with deep breath and sob with activity, feels just like his sarcoid. rec prednisone 10 2 each am until better then taper to one half   July 21, 2009 ov 100% except doe x exertion so decreased to 10 mg one half daily.   October 18, 2009 cc his breathing has overall been okay. Pt taking 5 mg of prednisone qd. No complaints of aches/ pains/ rash/sob /coough/ocular co's. rec try pred 5 mg every other day then return for f/u, did not return as rec.   May 24, 2010 ov Followup sarcoid. finished prednisone rx around 01/2010 breathing did fine off it completely. > maintain on 5 mg qod   04/25/2011 ov/Kenneth Delacruz cc doe heavy ex only. No ocular or articular symptoms, rash or cough off prednisone x 2 weeks when ran out without flare of any of the previous symptoms. rec The lowest dose of prednisone is always the best dose as long as it works Try off prednisone completely and if the same problem comes back start back on prednisone 10 mg per day until back to normal, then one half x two weeks and then one half on odd days until seen every 3 months   12/25/2012 f/u ov/Kenneth Delacruz prednisone down to 5 mg qod then increased to 10 mg per day x 6 weeks due to tightness/ cough ? Also maybe related to resuming smoking with loss of wife.  No rash or arthralgias.  rec Prednisone ceiling is 10 mg day and floor is 5 mg every other day Please schedule a follow up office visit in 6 weeks, call sooner if needed PFT's and CxR > not done    11/16/2013 f/u ov/Kenneth Delacruz resumed smoking e:  Sarcoid/ hbp Chief Complaint  Patient presents with  . Follow-up    Pt c/o rt side CP-  when takes in a deep breath or turns a certain way x 2 wks. He also reports slight increase in DOE for the past couple wks.   Floor of 5 mg qod  Most most the time works  Cp identical to prev flares bilateral R > L flanks, positional and worse worse with deep breath  rec No change prednisone 10 mg one half every other day and ceiling 10 day     Admit date: 02/22/2014  Discharge date: 02/25/2014  Discharge Diagnoses:  Active Problems:  PULMONARY SARCOIDOSIS  HYPERTENSION  Hypoxia  NSTEMI, initial episode of care  CAP (community acquired pneumonia)  Community acquired bacterial pneumonia  Detailed Hospital Course:  39 yo male with long history of sarcoidosis who is on 10 mg of prednisone daily and a current smoker, presents with abrupt onset of dry cough that woke him from sleep. 2/23 early morning awoke from sleep with a dry cough, unable to stop, with significant shortness of breath. At ED febrile and hypoxic. He was placed on 2 L [place], with SpO2 >95% following albuterol nebulizer. He became more relaxed, but remained tachypneic and tachycardic. He was also found to have elevated troponin. [facility]osis exacerbation, and elevated  troponin. The troponin was thought to be related to demand ischemia. He was also treated for CAP. His treatments included supplemental O2, nebulized bronchodilators, and IV antibiotics. His respiratory status continued to improve and as such his troponin slowly decreased. His antibiotics were narrowed to cipro. His risk for CAD/plaque rupture where thought to be low, but he was schedule for [facility] 3/26. Myoview done and showed no evidence of myocardial ischemia or scar.  Discharge Plan by diagnoses  Sarcoidosis, suspected exacerbation  CAP  Elevated troponin. NSTEMI vs demand ischemia, most likely demand ischemia.  Tobacco use disorder  Discharge Plan:  Prednisone 40, taper down to chronic dose ( ) over 2 weeks  Cipro 750 mg BID x 10 days  total - Last day 4/1  Continue home BP medication  Smoking cessation     03/04/2014 f/u ov/Kenneth Delacruz re:  sarcoid Chief Complaint  Patient presents with  . HFU    Pt reports that his breathing has improved since hospital d/c, but not back to his normal baseline yet. He states that when he does too much talking or gets SOB he has non prod cough.    prednisone tapered to 30 mg per day  rec Ceiling for prednisone is  20 mg daily and the floor is 5 mg every other day.   03/31/2015 f/u ov/Kenneth Delacruz re:  Chief Complaint  Patient presents with  . Follow-up    Pt c/o of SOB with activity, dry cough, slight wheezing at bedtime. Occasional chest tightness/burning sensation.   no flare x sev months then indolent onset progressive symptoms until restarted prednisone 10 mg  One week prior to OV  And tapered rapidly to 10 mg one half qod did not understand ceiling concept and still having doe/ dry cough and noct wheeze though not as bad as when off pred completely, no new symptoms vs original dx rec Prednisone 10 mg 2 each am until better,  Then one daily x one week, then one half daily x one week then one half every other day     09/27/2015  f/u ov/Kenneth Delacruz re: final sarcoid/ hbp f/u No cough / good ex tol s sob/  no wt loss  Misses some pills s obvious adverse effects   No obvious day to day or daytime variability or assoc chronic cough or cp or chest tightness, subjective wheeze or overt sinus or hb symptoms. No unusual exp hx or h/o childhood pna/ asthma or knowledge of premature birth.  Sleeping ok without nocturnal  or early am exacerbation  of respiratory  c/o's or need for noct saba. Also denies any obvious fluctuation of symptoms with weather or environmental changes or other aggravating or alleviating factors except as outlined above   Current Medications, Allergies, Complete Past Medical History, Past Surgical History, Family History, and Social History were reviewed in [employer] record.  ROS  The following are not active complaints unless bolded sore throat, dysphagia, dental problems, itching, sneezing,  nasal congestion or excess/ purulent secretions, ear ache,   fever, chills, sweats, unintended wt loss, classically pleuritic or exertional cp, hemoptysis,  orthopnea pnd or leg swelling, presyncope, palpitations, abdominal pain, anorexia, nausea, vomiting, diarrhea  or change in bowel or bladder habits, change in stools or urine, dysuria,hematuria,  rash, arthralgias, visual complaints, headache, numbness, weakness or ataxia or problems with walking or coordination,  change in mood/affect or memory.         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 Nl external ear canals without cough reflex   NECK :  without JVD/Nodes/TM/ nl carotid upstrokes bilaterally   LUNGS: no acc muscle use, clear to A and P bilaterally without cough on insp or exp maneuvers   CV:  RRR  no s3 or murmur or increase in P2, no edema   ABD:  soft and nontender with nl excursion in the supine position. No bruits or organomegaly, bowel sounds nl  MS:  warm without deformities, calf tenderness, cyanosis or clubbing     CXR PA and Lateral:   03/31/2015 :     I personally reviewed images and agree with radiology impression as follows:    pt declined as no insurance       Plan:

## 2015-09-27 NOTE — Assessment & Plan Note (Signed)
Developed on sytemic steroids  Remains poorly controlled, admits misses 2/7 doses per week > rec primary care of his choice unless he proves he needs to see me re sarcoid rx.

## 2015-09-27 NOTE — Assessment & Plan Note (Signed)
  I reviewed the Fletcher curve with the patient that basically indicates  if you quit smoking when your best day FEV1 is still well preserved (as is clearly  the case here)  it is highly unlikely you will progress to severe disease and informed the patient there was no medication on the market that has proven to alter the curve/ its downward trajectory  or the likelihood of progression of their disease.  Therefore stopping smoking and maintaining abstinence is the most important aspect of care, not choice of inhalers or for that matter, doctors.   

## 2015-09-27 NOTE — Assessment & Plan Note (Signed)
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 While this could be related to an acute  exacerbation of sarcoidosis are other inflammatory process, the  unilateral nature of this raises the possibility of infection,  particularly atypical infection.  Trace bilateral effusions - New ceiling prednisone as of 03/04/14 = 20 mg day with floor of 5 mg qod> taper off by 11/03/15

## 2015-09-27 NOTE — Patient Instructions (Addendum)
Prednisone 2.5 mg every other day x 2 weeks then 2.5 mg every 3d day x 2 weeks then try off see if cough / short of breath or nausea and if not stay off it indefinitely.   Your blood pressure should be fine once you are off prednisone but if not let me refer you to a primary care doctor who you will need to see anyway once your maxzide runs out if your blood pressure stays above 140/90  Please remember to go to the lab   department downstairs for your tests - we will call you with the results when they are available.

## 2015-09-29 NOTE — Progress Notes (Signed)
Quick Note:  Spoke with pt and notified of results per Dr. Wert. Pt verbalized understanding and denied any questions.  ______ 

## 2016-08-20 ENCOUNTER — Emergency Department (HOSPITAL_COMMUNITY)
Admission: EM | Admit: 2016-08-20 | Discharge: 2016-08-20 | Disposition: A | Discharge status: Home / Self Care | Payer: BLUE CROSS/BLUE SHIELD | Attending: Emergency Medicine | Admitting: Emergency Medicine

## 2016-08-20 ENCOUNTER — Encounter (HOSPITAL_COMMUNITY): Payer: Self-pay

## 2016-08-20 DIAGNOSIS — M542 Cervicalgia: Secondary | ICD-10-CM | POA: Insufficient documentation

## 2016-08-20 DIAGNOSIS — I252 Old myocardial infarction: Secondary | ICD-10-CM | POA: Insufficient documentation

## 2016-08-20 DIAGNOSIS — I10 Essential (primary) hypertension: Secondary | ICD-10-CM | POA: Diagnosis not present

## 2016-08-20 DIAGNOSIS — M791 Myalgia: Secondary | ICD-10-CM | POA: Diagnosis not present

## 2016-08-20 DIAGNOSIS — F17210 Nicotine dependence, cigarettes, uncomplicated: Secondary | ICD-10-CM | POA: Diagnosis not present

## 2016-08-20 DIAGNOSIS — M7918 Myalgia, other site: Secondary | ICD-10-CM

## 2016-08-20 MED ORDER — OXYCODONE-ACETAMINOPHEN 5-325 MG PO TABS
1.0000 | ORAL_TABLET | ORAL | Status: DC | PRN
Start: 2016-08-20 — End: 2016-08-21
  Administered 2016-08-20: 1 via ORAL
  Filled 2016-08-20: qty 1

## 2016-08-20 MED ORDER — OXYCODONE-ACETAMINOPHEN 5-325 MG PO TABS
ORAL_TABLET | ORAL | Status: AC
  Filled 2016-08-20: qty 1

## 2016-08-20 MED ORDER — CYCLOBENZAPRINE HCL 10 MG PO TABS
10.0000 mg | ORAL_TABLET | Freq: Once | ORAL | Status: AC
  Administered 2016-08-20: 10 mg via ORAL
  Filled 2016-08-20: qty 1

## 2016-08-20 MED ORDER — CYCLOBENZAPRINE HCL 5 MG PO TABS: 5.0000 mg | ORAL_TABLET | Freq: Three times a day (TID) | ORAL | 0 refills | Status: AC | PRN

## 2016-08-20 NOTE — ED Triage Notes (Signed)
Pt took a motorcycle class last Thursday and has been having pain to the neck region since Thursday

## 2016-08-20 NOTE — Discharge Instructions (Signed)
Read the information below. I suspect you have musculoskeletal pain. I have prescribed a muscle relaxer. Take as directed. This can make you drowsy, do not drive after taking.   You can take tylenol 650mg  every 6hrs or motrin 400mg  every 6hrs for pain relief.  You can continue to use topical cream if provides relief. You can also try heat or ice to affected areas for 20 minute increments.  Your blood pressure was elevated. It is very important that you take your blood pressure medication. Elevated blood pressure places you at risk for stroke, heart problems, and kidney problems.  Use the prescribed medication as directed.  Please discuss all new medications with your pharmacist.   Be sure to call and schedule a follow up appointment with your primary care doctor.  You may return to the Emergency Department at any time for worsening condition or any new symptoms that concern you. Return to ED if you develop changes in vision, numbness, weakness, headache, or fever.

## 2016-08-20 NOTE — ED Provider Notes (Signed)
[facility]-EMERGENCY DEPT Provider Note   CSN: [id number] Arrival date & time: 08/20/16  1607  By signing my name below, I, Phillis HaggisGabriella Gaje, attest that this documentation has been prepared under the direction and in the presence of Arvilla MeresAshley Meyer, PA-C.Marland Kitchen. Electronically Signed: Phillis HaggisGabriella Gaje, ED Scribe. 08/20/16. 9:27 PM.   History   Chief Complaint Chief Complaint  Patient presents with  . Neck Pain    pt c/o neck pain since last Thursday    The history is provided by the patient. No language interpreter was used.   HPI Comments: Kenneth Delacruz is a 40 y.o. Male with a hx of HTN and pulmonary sarcoidosis who presents to the Emergency Department complaining of gradually worsening b/l neck pain with spasms onset one week ago. He reports associated neck stiffness. Pt took a motorcycle class prior to the pain starting. No trauma. He has worsening pain with movement of the head and relief with staying very still. He has used a heating pad, topical analgesic, and OTC pain medication to mild relief. Pt states that he has not been taking his HTN medication regularly. He denies fever, chills, nausea, vomiting, visual changes, chest pain, shortness of breath, bladder or bowel incontinence, facial droop, trouble swallowing, headaches, numbness, weakness, or speech difficulty. No h/o cancer or IVDU.    Past Medical History:  Diagnosis Date  . HTN (hypertension)   . Pneumonia   . Pulmonary sarcoidosis (HCC) 2007  . Weight loss     Patient Active Problem List   Diagnosis Date Noted  . NSTEMI, initial episode of care (HCC) 02/23/2014  . Smoker 12/25/2012  . PULMONARY SARCOIDOSIS 06/07/2009  . Essential hypertension 06/07/2009    Past Surgical History:  Procedure Laterality Date  . transbronchial biopsy  3.8.07    Home Medications    Prior to Admission medications   Medication Sig Start Date End Date Taking? Authorizing Provider  cyclobenzaprine (FLEXERIL) 5 MG tablet Take 1 tablet (5 mg total)  by mouth 3 (three) times daily as needed for muscle spasms. 08/20/16   Lona KettleAshley Laurel Meyer, PA-C  predniSONE (DELTASONE) 2.5 MG tablet Use as directed 09/27/15   Nyoka CowdenMichael B Wert, MD  triamterene-hydrochlorothiazide (MAXZIDE) 75-50 MG tablet Take 1 tablet by mouth daily. 08/22/16   Nyoka CowdenMichael B Wert, MD    Family History Family History  Problem Relation Age of Onset  . Asthma Mother   . Stroke Mother     Social History Social History  Substance Use Topics  . Smoking status: Current Some Day Smoker    Packs/day: 0.30    Years: 12.00    Types: Cigarettes    Last attempt to quit: 02/13/2013  . Smokeless tobacco: Never Used  . Alcohol use 0.6 oz/week    1 Standard drinks or equivalent per week     Allergies   Review of patient's allergies indicates no known allergies.   Review of Systems Review of Systems  Constitutional: Negative for chills and fever.  HENT: Negative for trouble swallowing.   Eyes: Negative for visual disturbance.  Respiratory: Negative for shortness of breath.   Cardiovascular: Negative for chest pain.  Gastrointestinal: Negative for nausea and vomiting.  Musculoskeletal: Positive for neck pain and neck stiffness.  Neurological: Negative for facial asymmetry, speech difficulty, weakness, numbness and headaches.     Physical Exam Updated Vital Signs BP (!) 185/110 (BP Location: Left Arm)   Pulse 65   Temp 98.3 F (36.8 C) (Oral)   Resp 16   Ht 5\' 10"  (  1.778 m)   Wt 220 lb (99.8 kg)   SpO2 95%   BMI 31.57 kg/m   Physical Exam  Constitutional: He appears well-developed and well-nourished. No distress.  HENT:  Head: Normocephalic and atraumatic.  Eyes: Conjunctivae and EOM are normal. No scleral icterus.  Neck: Normal range of motion, full passive range of motion without pain and phonation normal. Neck supple. No spinous process tenderness and no muscular tenderness present. No neck rigidity. Normal range of motion present.  No tenderness of the  musculature  Cardiovascular: Normal rate.   Pulmonary/Chest: Effort normal. No stridor. No respiratory distress.  Musculoskeletal: Normal range of motion.  Neurological: He is alert. He has normal strength. He is not disoriented. No sensory deficit. Coordination and gait normal. GCS eye subscore is 4. GCS verbal subscore is 5. GCS motor subscore is 6.  Mental Status:  Alert, thought content appropriate, able to give a coherent history. Speech fluent without evidence of aphasia. Able to follow 2 step commands without difficulty.  Cranial Nerves:  II: pupils equal, round, reactive to light III,IV, VI: ptosis not present, extra-ocular motions intact bilaterally  V,VII: smile symmetric, facial light touch sensation equal VIII: hearing grossly normal to voice  IX, X: uvula elevates symmetrically  XI: bilateral shoulder shrug symmetric and strong XII: midline tongue extension without fassiculation  Skin: Skin is warm and dry. He is not diaphoretic.  Psychiatric: He has a normal mood and affect. His behavior is normal.  Nursing note and vitals reviewed.    ED Treatments / Results  DIAGNOSTIC STUDIES: Oxygen Saturation is 95% on RA, normal by my interpretation.    COORDINATION OF CARE: 9:24 PM-Discussed treatment plan which includes Naprosyn and muscle relaxant with pt at bedside and pt agreed to plan.    Labs (all labs ordered are listed, but only abnormal results are displayed) Labs Reviewed - No data to display  EKG  EKG Interpretation None       Radiology No results found.  Procedures Procedures (including critical care time)  Medications Ordered in ED Medications  cyclobenzaprine (FLEXERIL) tablet 10 mg (10 mg Oral Given 08/20/16 2135)     Initial Impression / Assessment and Plan / ED Course  I have reviewed the triage vital signs and the nursing notes.  Pertinent labs & imaging results that were available during my care of the patient were reviewed by me and  considered in my medical decision making (see chart for details).  Clinical Course    Patient presents to ED with b/l neck pain and stiffness. No trauma. Patient is afebrile and non-toxic appearing in NAD. Vital signs remarkable for HTN - pt h/o HTN, medication non-compliance, asx. Discussed importance of taking HTN medication regularly, discussed risks of untreated HTN to include stroke, MI, and kidney dysfunction. Physical exam re-assuring. No focal neuro deficits. Low suspicion for meningitis - afebrile, non-toxic, no headache, neck ROM intact. Based on [place] c-spine rule do not feel imaging of neck warranted at this time. Suspect muscle spasm. Discussed plan with pt to include symptomatic management with heat/ice and tylenol/motrin for pain relief. Rx flexeril. Follow up with PCP regarding neck pain if persists and HTN. Return precautions given. Pt voiced understanding and is agreeable.    Final Clinical Impressions(s) / ED Diagnoses   Final diagnoses:  Musculoskeletal pain  Neck pain  I personally performed the services described in this documentation, which was scribed in my presence. The recorded information has been reviewed and is accurate.   New  Prescriptions Discharge Medication List as of 08/20/2016  9:33 PM    START taking these medications   Details  cyclobenzaprine (FLEXERIL) 5 MG tablet Take 1 tablet (5 mg total) by mouth 3 (three) times daily as needed for muscle spasms., Starting Mon 08/20/2016, Print         [place], [place] 08/22/16 1549    Lyndal Pulley, MD 08/25/16 [phone]

## 2016-08-20 NOTE — ED Notes (Signed)
Provider notified of discharging bp, educated pt on importance of medication compliance.

## 2016-08-21 ENCOUNTER — Telehealth: Payer: Self-pay | Admitting: Internal Medicine

## 2016-08-21 MED ORDER — TRIAMTERENE-HCTZ 75-50 MG PO TABS: 1.0000 | ORAL_TABLET | Freq: Every day | ORAL | 2 refills | Status: DC

## 2016-08-21 NOTE — Telephone Encounter (Signed)
Patient Instructions    Prednisone 2.5 mg every other day x 2 weeks then 2.5 mg every 3d day x 2 weeks then try off see if cough / short of breath or nausea and if not stay off it indefinitely.   Your blood pressure should be fine once you are off prednisone but if not let me refer you to a primary care doctor who you will need to see anyway once your maxzide runs out if your blood pressure stays above 140/90  Please remember to go to the lab   department downstairs for your tests - we will call you with the results when they are available.      Spoke with pt. He is needing a refill on his BP medication. Pt was never referred to another PCP per MW's instructions. I advised pt that I would go ahead and refill his medication for him so that he doesn't run out.  MW - please advise about referral. Thanks.

## 2016-08-21 NOTE — Telephone Encounter (Signed)
LM x 1 

## 2016-08-21 NOTE — Telephone Encounter (Signed)
Ok x 3 m supply but [facility]

## 2016-08-22 MED ORDER — TRIAMTERENE-HCTZ 75-50 MG PO TABS: 1.0000 | ORAL_TABLET | Freq: Every day | ORAL | 2 refills | Status: AC

## 2016-08-22 NOTE — Telephone Encounter (Signed)
Called spoke with pt. Reviewed results and recs. Pt voiced understanding and had no further questions. Rx sent. Nothing further needed.

## 2016-09-19 ENCOUNTER — Telehealth: Payer: Self-pay | Admitting: Internal Medicine

## 2016-09-19 MED ORDER — PREDNISONE 10 MG PO TABS: ORAL_TABLET | ORAL | 0 refills | Status: AC

## 2016-09-19 NOTE — Telephone Encounter (Signed)
Rx sent to preferred pharmacy. Pt aware of MW recommendations and voiced understanding. Nothing further needed.

## 2016-09-19 NOTE — Telephone Encounter (Signed)
Prednisone 10mg   One daily until better then one half daily until seen  #30 no refills   If hasn't been able to come off pred completely then ov here in 4 weeks.or primary can take over on dosing after that

## 2016-09-19 NOTE — Telephone Encounter (Signed)
MW please advise if we can refill the prednisone and for what quantity and mg and how many refills.  thanks

## 2017-01-10 DIAGNOSIS — I10 Essential (primary) hypertension: Secondary | ICD-10-CM | POA: Diagnosis not present

## 2017-02-21 DIAGNOSIS — I10 Essential (primary) hypertension: Secondary | ICD-10-CM | POA: Diagnosis not present

## 2017-02-21 DIAGNOSIS — D860 Sarcoidosis of lung: Secondary | ICD-10-CM | POA: Diagnosis not present

## 2017-03-25 DIAGNOSIS — I10 Essential (primary) hypertension: Secondary | ICD-10-CM | POA: Diagnosis not present

## 2018-03-14 DIAGNOSIS — S335XXA Sprain of ligaments of lumbar spine, initial encounter: Secondary | ICD-10-CM | POA: Diagnosis not present

## 2018-04-07 DIAGNOSIS — D860 Sarcoidosis of lung: Secondary | ICD-10-CM | POA: Diagnosis not present

## 2018-04-07 DIAGNOSIS — R05 Cough: Secondary | ICD-10-CM | POA: Diagnosis not present

## 2018-04-07 DIAGNOSIS — J209 Acute bronchitis, unspecified: Secondary | ICD-10-CM | POA: Diagnosis not present

## 2018-04-21 DIAGNOSIS — I10 Essential (primary) hypertension: Secondary | ICD-10-CM | POA: Diagnosis not present

## 2018-04-21 DIAGNOSIS — D860 Sarcoidosis of lung: Secondary | ICD-10-CM | POA: Diagnosis not present

## 2018-04-21 DIAGNOSIS — J209 Acute bronchitis, unspecified: Secondary | ICD-10-CM | POA: Diagnosis not present

## 2018-04-30 DIAGNOSIS — M25522 Pain in left elbow: Secondary | ICD-10-CM | POA: Diagnosis not present

## 2020-03-31 ENCOUNTER — Ambulatory Visit: Payer: Self-pay | Attending: Family

## 2020-03-31 DIAGNOSIS — Z23 Encounter for immunization: Secondary | ICD-10-CM

## 2020-03-31 NOTE — Progress Notes (Signed)
   Covid-19 Vaccination Clinic  Name:  Kenneth Delacruz    MRN: [id number] DOB: 12-06-1975  03/31/2020  [name] was observed post Covid-19 immunization for 15 minutes without incident. He was provided with Vaccine Information Sheet and instruction to access the V-Safe system.   [name] was instructed to call 911 with any severe reactions post vaccine: Marland Kitchen Difficulty breathing  . Swelling of face and throat  . A fast heartbeat  . A bad rash all over body  . Dizziness and weakness   Immunizations Administered    Name Date Dose VIS Date Route   Moderna COVID-19 Vaccine 03/31/2020  1:33 PM 0.5 mL 11/2019 Intramuscular   Manufacturer: Moderna   Lot: [id number]   NDC: 37357-897-84

## 2020-04-26 ENCOUNTER — Ambulatory Visit: Payer: Self-pay | Attending: Family

## 2020-04-26 DIAGNOSIS — Z23 Encounter for immunization: Secondary | ICD-10-CM

## 2020-04-26 NOTE — Progress Notes (Signed)
   Covid-19 Vaccination Clinic  Name:  Kenneth Delacruz    MRN: [id number] DOB: 11/05/1976  04/26/2020  Mr. Kenneth Delacruz was observed post Covid-19 immunization for 15 minutes without incident. He was provided with Vaccine Information Sheet and instruction to access the V-Safe system.   Mr. Kenneth Delacruz was instructed to call 911 with any severe reactions post vaccine: Marland Kitchen Difficulty breathing  . Swelling of face and throat  . A fast heartbeat  . A bad rash all over body  . Dizziness and weakness   Immunizations Administered    Name Date Dose VIS Date Route   Moderna COVID-19 Vaccine 04/26/2020  1:04 PM 0.5 mL 11/2019 Intramuscular   Manufacturer: Moderna   Lot: [id number]   NDC: 88337-445-14

## 2021-01-23 ENCOUNTER — Telehealth (HOSPITAL_COMMUNITY): Payer: Self-pay

## 2021-01-23 NOTE — Telephone Encounter (Signed)
Called and spoke with pt in regards to CR, pt stated he is not interested at this time.   Mailed boucher
